# Patient Record
Sex: Male | Born: 1965
Health system: Southern US, Community
[De-identification: ages and names within clinical notes are randomized; demographics above are authoritative.]

## PROBLEM LIST (undated history)

## (undated) DIAGNOSIS — K279 Peptic ulcer, site unspecified, unspecified as acute or chronic, without hemorrhage or perforation: Secondary | ICD-10-CM

## (undated) HISTORY — PX: CERVICAL DISCECTOMY: SHX98

## (undated) HISTORY — PX: MIDDLE EAR SURGERY: SHX713

---

## 2012-06-29 ENCOUNTER — Emergency Department (HOSPITAL_COMMUNITY): Payer: Self-pay

## 2012-06-29 ENCOUNTER — Emergency Department (HOSPITAL_COMMUNITY)
Admission: EM | Admit: 2012-06-29 | Discharge: 2012-06-29 | Disposition: A | Discharge status: Home / Self Care | Payer: Self-pay | Attending: Emergency Medicine | Admitting: Emergency Medicine

## 2012-06-29 DIAGNOSIS — W1789XA Other fall from one level to another, initial encounter: Secondary | ICD-10-CM | POA: Insufficient documentation

## 2012-06-29 DIAGNOSIS — M25519 Pain in unspecified shoulder: Secondary | ICD-10-CM

## 2012-06-29 DIAGNOSIS — R079 Chest pain, unspecified: Secondary | ICD-10-CM | POA: Insufficient documentation

## 2012-06-29 DIAGNOSIS — S20219A Contusion of unspecified front wall of thorax, initial encounter: Secondary | ICD-10-CM

## 2012-06-29 DIAGNOSIS — R209 Unspecified disturbances of skin sensation: Secondary | ICD-10-CM | POA: Insufficient documentation

## 2012-06-29 DIAGNOSIS — R51 Headache: Secondary | ICD-10-CM | POA: Insufficient documentation

## 2012-06-29 DIAGNOSIS — W139XXA Fall from, out of or through building, not otherwise specified, initial encounter: Secondary | ICD-10-CM

## 2012-06-29 MED ORDER — HYDROCODONE-ACETAMINOPHEN 5-500 MG PO TABS: 1.0000 | ORAL_TABLET | Freq: Four times a day (QID) | ORAL | Status: AC | PRN

## 2012-06-29 MED ORDER — OXYCODONE-ACETAMINOPHEN 5-325 MG PO TABS
1.0000 | ORAL_TABLET | Freq: Once | ORAL | Status: AC
  Administered 2012-06-29: 1 via ORAL
  Filled 2012-06-29: qty 1

## 2012-06-29 MED ORDER — NAPROXEN 500 MG PO TABS: 500.0000 mg | ORAL_TABLET | Freq: Two times a day (BID) | ORAL | Status: DC

## 2012-06-29 NOTE — ED Notes (Signed)
Ortho tech called 

## 2012-06-29 NOTE — ED Notes (Signed)
Patient is alert and oriented x3.  He is complaining of rib pain, right shoulder after  A fall from a roof approximately 12 feet.  He denies any LOC or head impact.   He states he does have a headache and also difficulty with taking deep breaths. Patient has been ambulatory after fall.

## 2012-06-29 NOTE — ED Provider Notes (Signed)
Medical screening examination/treatment/procedure(s) were performed by non-physician practitioner and as supervising physician I was immediately available for consultation/collaboration.   Rolan Bucco, MD 06/29/12 1538

## 2012-06-29 NOTE — ED Provider Notes (Signed)
History     CSN: [id number]  Arrival date & time 06/29/12  1048   First MD Initiated Contact with Patient 06/29/12 1051      Chief Complaint  Patient presents with  . Fall    (Consider location/radiation/quality/duration/timing/severity/associated sxs/prior treatment) Patient is a 46 y.o. male presenting with fall. The history is provided by the patient.  Fall The accident occurred 1 to 2 hours ago. The fall occurred from a roof. He fell from a height of 11 to 15 ft. He landed on grass. There was no blood loss. Point of impact: right side of the body. The pain is present in the right shoulder (right ribs). The pain is at a severity of 8/10. The pain is moderate. Associated symptoms include headaches and tingling. Pertinent negatives include no fever, no numbness, no abdominal pain, no nausea, no vomiting and no loss of consciousness. The symptoms are aggravated by activity. He has tried nothing for the symptoms.  Pt states he slipped and fell off the roof landing flat onto right side in the bushes. States pain to right shoulder and right ribs. Pain with deep inspiration and movement of the shoulder. Denies hitting his head, no neck pain. States right hand feels 'tingly" but no numbness or weakness. States abdomen feels "uncomfortable" but no pain. Denies LOC. Denies nausea, vomiting, dizziness. Ambulatory since then. Did not take any medications.   No past medical history on file.  No past surgical history on file.  No family history on file.  History  Substance Use Topics  . Smoking status: Not on file  . Smokeless tobacco: Not on file  . Alcohol Use: Not on file      Review of Systems  Constitutional: Negative for fever and chills.  HENT: Negative for neck pain and neck stiffness.   Respiratory: Negative.   Cardiovascular: Positive for chest pain.  Gastrointestinal: Negative for nausea, vomiting and abdominal pain.  Musculoskeletal: Positive for joint swelling and  arthralgias.  Skin: Negative for wound.  Neurological: Positive for tingling and headaches. Negative for dizziness, loss of consciousness, weakness, light-headedness and numbness.    Allergies  Review of patient's allergies indicates no known allergies.  Home Medications   Current Outpatient Rx  Name Route Sig Dispense Refill  . CETIRIZINE HCL 10 MG PO TABS Oral Take 10 mg by mouth daily.    . OMEGA-3 FATTY ACIDS 1000 MG PO CAPS Oral Take 1 g by mouth daily.      BP 139/78  Pulse 98  Temp 97.9 F (36.6 C) (Oral)  Resp 18  SpO2 100%  Physical Exam  Nursing note and vitals reviewed. Constitutional: He is oriented to person, place, and time. He appears well-developed and well-nourished. No distress.  Neck: Normal range of motion. Neck supple.       No midline tenderness, no paravertebral tenderness. No swelling, bruising, deformity  Cardiovascular: Normal rate, regular rhythm and normal heart sounds.   Pulmonary/Chest: Effort normal and breath sounds normal. No respiratory distress. He has no wheezes. He has no rales.       Tender to palpation over right lower ribs. No deformity, no crepitus  Abdominal: Soft. Bowel sounds are normal. He exhibits no distension. There is no tenderness. There is no rebound and no guarding.  Musculoskeletal:       Tender to palpation over right shoulder and scapula. No deformity. No bruising, no swelling. Pain with movement of the shoulder joint. Unable to actively move, pain with passive movement over  90 degrees. Normal elbow and wrist exam with full rom with no pain. Distal radial pulses normal and equal bilaterally. No tenderness or deformity over the clavicle  Lymphadenopathy:    He has no cervical adenopathy.  Neurological: He is alert and oriented to person, place, and time.       Equal grip strength bilaterally. Normal sensation over right arm and hand compared to left  Skin: Skin is warm and dry.    ED Course  Procedures (including critical  care time)  Pt post fall about 84ft. No injury to the head, neck, no LOC. C spine cleared using nexus criteria. Pt with pain to right shoulder, right ribs. No abdominal pain or tenderness. No neuro deficits. Will get x-rays.   No results found for this or any previous visit. Dg Ribs Unilateral W/chest Right  06/29/2012  *RADIOLOGY REPORT*  Clinical Data: Status post fall.  Right chest pain.  RIGHT RIBS AND CHEST - 3+ VIEW  Comparison: None.  Findings: Lungs are clear.  No pneumothorax or pleural fluid. Heart size is normal.  Remote right clavicle fracture is noted.  No acute fracture is identified.  Tiny radiopaque density projecting over the right upper quadrant may be a calcification within the liver or possibly a subcutaneous foreign body.  IMPRESSION: No acute finding.  Old right clavicle fracture.  Original Report Authenticated By: Bernadene Bell. Maricela Curet, M.D.   Dg Scapula Right  06/29/2012  *RADIOLOGY REPORT*  Clinical Data: Fall, pain.  RIGHT SCAPULA - 2+ VIEWS  Comparison: None.  Findings: No acute bony or joint abnormality is identified.  Old healed right clavicle fracture and mild acromioclavicular degenerative change noted.  IMPRESSION: No acute finding.  Original Report Authenticated By: Bernadene Bell. Maricela Curet, M.D.   Dg Shoulder Right  06/29/2012  *RADIOLOGY REPORT*  Clinical Data: Status post fall.  Pain.  RIGHT SHOULDER - 2+ VIEW  Comparison: None.  Findings: The humerus is located and the acromioclavicular joint is intact with some degenerative change noted.  There is no acute fracture.  Old healed right clavicle fracture is noted.  IMPRESSION: No acute finding.  Old healed right clavicle fracture and mild appearing acromioclavicular degenerative change.  Original Report Authenticated By: Bernadene Bell. D'ALESSIO, M.D.   12:48 PM X-rays are negative. Pt in NAD, he is non toxic. He is walking around the room. Moving shoulder more after percocet. States he feels well. Will d/c home with a sling.  Follow up with orthopedics.      1. Fall from building   2. Shoulder pain   3. Rib contusion       MDM          Lottie Mussel, PA 06/29/12 1524

## 2012-08-22 ENCOUNTER — Emergency Department (HOSPITAL_COMMUNITY): Payer: Self-pay

## 2012-08-22 ENCOUNTER — Emergency Department (HOSPITAL_COMMUNITY)
Admission: EM | Admit: 2012-08-22 | Discharge: 2012-08-22 | Disposition: A | Discharge status: Home / Self Care | Payer: Self-pay | Attending: Emergency Medicine | Admitting: Emergency Medicine

## 2012-08-22 ENCOUNTER — Encounter (HOSPITAL_COMMUNITY): Payer: Self-pay | Admitting: Emergency Medicine

## 2012-08-22 DIAGNOSIS — F17200 Nicotine dependence, unspecified, uncomplicated: Secondary | ICD-10-CM | POA: Insufficient documentation

## 2012-08-22 DIAGNOSIS — S60949A Unspecified superficial injury of unspecified finger, initial encounter: Secondary | ICD-10-CM

## 2012-08-22 DIAGNOSIS — S61209A Unspecified open wound of unspecified finger without damage to nail, initial encounter: Secondary | ICD-10-CM | POA: Insufficient documentation

## 2012-08-22 DIAGNOSIS — W298XXA Contact with other powered powered hand tools and household machinery, initial encounter: Secondary | ICD-10-CM | POA: Insufficient documentation

## 2012-08-22 MED ORDER — HYDROCODONE-ACETAMINOPHEN 5-325 MG PO TABS: 2.0000 | ORAL_TABLET | ORAL | Status: AC | PRN

## 2012-08-22 MED ORDER — IBUPROFEN 800 MG PO TABS: 800.0000 mg | ORAL_TABLET | Freq: Three times a day (TID) | ORAL | Status: AC

## 2012-08-22 NOTE — ED Notes (Signed)
1.5 cm Laceration to pad of index finger on l/hand. Edges not well approx. Bleeding controlled when pressure removed

## 2012-08-22 NOTE — ED Provider Notes (Signed)
History     CSN: [id number]  Arrival date & time 08/22/12  1020   First MD Initiated Contact with Patient 08/22/12 1048      Chief Complaint  Patient presents with  . Finger Injury    laceration to index finger on l/lhand due to circular saw    (Consider location/radiation/quality/duration/timing/severity/associated sxs/prior treatment) HPI Comments: Patient presents after an injury to his left index finger pad due to a circular saw. The injury occurred 1 hour ago. The pain immediately occurred after injury along with bleeding from injury site. The pain has continued since initial injury and is severe and throbbing. Patient reports intolerable pain with any contact to the wound. He did not take anything for pain. He denies any alleviating factors. He denies numbness/tingling, abnormal coordination, coolness/paleness of extremity.    History reviewed. No pertinent past medical history.  Past Surgical History  Procedure Date  . Middle ear surgery   . Cervical discectomy     History reviewed. No pertinent family history.  History  Substance Use Topics  . Smoking status: Current Everyday Smoker    Types: Cigarettes  . Smokeless tobacco: Not on file  . Alcohol Use: Yes      Review of Systems  Skin: Positive for wound.  All other systems reviewed and are negative.    Allergies  Review of patient's allergies indicates no known allergies.  Home Medications   Current Outpatient Rx  Name Route Sig Dispense Refill  . B COMPLEX-C PO TABS Oral Take 1 tablet by mouth daily.    Marland Kitchen CETIRIZINE HCL 10 MG PO TABS Oral Take 10 mg by mouth daily.    . OMEGA-3 FATTY ACIDS 1000 MG PO CAPS Oral Take 1 g by mouth daily.      BP 137/84  Pulse 90  Temp 97.9 F (36.6 C) (Oral)  Resp 16  Ht 5\' 10"  (1.778 m)  Wt 185 lb (83.915 kg)  BMI 26.54 kg/m2  SpO2 98%  Physical Exam  Nursing note and vitals reviewed. Constitutional: He is oriented to person, place, and time. He appears  well-developed and well-nourished. No distress.  HENT:  Head: Normocephalic and atraumatic.  Eyes: Conjunctivae are normal. No scleral icterus.  Neck: Normal range of motion. Neck supple.  Cardiovascular: Normal rate, regular rhythm and intact distal pulses.  Exam reveals no gallop and no friction rub.   No murmur heard.      Capillary refill sufficient of upper extremities.   Pulmonary/Chest: Effort normal and breath sounds normal. No respiratory distress. He has no wheezes. He has no rales. He exhibits no tenderness.  Musculoskeletal: Normal range of motion.  Neurological: He is alert and oriented to person, place, and time.  Skin: Skin is warm and dry. He is not diaphoretic.       1 cm superficial laceration of left index finger pad.   Psychiatric: He has a normal mood and affect. His behavior is normal.    ED Course  Procedures (including critical care time)  LACERATION REPAIR Performed by: Emilia Beck, Ruben Reason, PA-S2 Authorized by: Emilia Beck Consent: Verbal consent obtained. Risks and benefits: risks, benefits and alternatives were discussed Consent given by: patient Patient identity confirmed: provided demographic data Prepped and Draped in normal sterile fashion Wound explored  Laceration Location: left index finger pad  Laceration Length: 1.0 cm  No Foreign Bodies seen or palpated  Anesthesia: digital block, local infiltration  Local anesthetic: lidocaine 2 % without epinephrine  Anesthetic total: 5.0  ml  Irrigation method: syringe Amount of cleaning: standard  Skin closure: 4.0 prolene  Number of sutures: 0, attempted closure but unsuccessful due to patient intolerance  Technique: attempted simple  Patient tolerance: Patient did not tolerate the procedure and steri strips were used instead of sutures.   Labs Reviewed - No data to display Dg Finger Index Left  08/22/2012  *RADIOLOGY REPORT*  Clinical Data: Cut with saw  LEFT INDEX FINGER  2+V  Comparison: None.  Findings: No acute fracture is seen.  Alignment is normal.  Joint spaces appear normal.  No opaque foreign body is seen.  IMPRESSION: Negative.   Original Report Authenticated By: Juline Patch, M.D.      1. Injury of finger, superficial       MDM  12:47 PM No evidence of fracture or foreign body at injury site. Wound closed with steri strips and bandaged. I will discharge him with pain medication and instructions to return to the ED with any worsening or concerning symptoms.   Patient tolerated steri strips application and dressing. He was instructed to keep the wound covered for 3 days and especially at work. I prescribed Norco for pain. No further evaluation needed at this time.       Emilia Beck, [place] 08/24/12 [phone]

## 2012-08-22 NOTE — ED Notes (Signed)
Wound care completed by PA.

## 2012-08-22 NOTE — ED Notes (Addendum)
Patient transported to X-ray 

## 2012-08-27 NOTE — ED Provider Notes (Signed)
I saw and evaluated the patient, reviewed the resident's note and I agree with the findings and plan.   Torrez Renfroe, MD 08/27/12 0818 

## 2017-02-28 ENCOUNTER — Emergency Department (HOSPITAL_COMMUNITY): Payer: Self-pay

## 2017-02-28 ENCOUNTER — Encounter (HOSPITAL_COMMUNITY): Payer: Self-pay | Admitting: Emergency Medicine

## 2017-02-28 ENCOUNTER — Emergency Department (HOSPITAL_COMMUNITY)
Admission: EM | Admit: 2017-02-28 | Discharge: 2017-02-28 | Disposition: A | Discharge status: Home / Self Care | Payer: Self-pay | Attending: Emergency Medicine | Admitting: Emergency Medicine

## 2017-02-28 DIAGNOSIS — F17210 Nicotine dependence, cigarettes, uncomplicated: Secondary | ICD-10-CM | POA: Insufficient documentation

## 2017-02-28 DIAGNOSIS — R1013 Epigastric pain: Secondary | ICD-10-CM | POA: Insufficient documentation

## 2017-02-28 LAB — COMPREHENSIVE METABOLIC PANEL
ALBUMIN: 4.7 g/dL (ref 3.5–5.0)
ALK PHOS: 76 U/L (ref 38–126)
ALT: 18 U/L (ref 17–63)
AST: 22 U/L (ref 15–41)
Anion gap: 8 (ref 5–15)
BUN: 19 mg/dL (ref 6–20)
CALCIUM: 9.7 mg/dL (ref 8.9–10.3)
CHLORIDE: 102 mmol/L (ref 101–111)
CO2: 28 mmol/L (ref 22–32)
CREATININE: 0.89 mg/dL (ref 0.61–1.24)
GFR calc Af Amer: >60 mL/min (ref >60)
GFR calc non Af Amer: >60 mL/min (ref >60)
GLUCOSE: 107 mg/dL — AB (ref 65–99)
Potassium: 3.8 mmol/L (ref 3.5–5.1)
SODIUM: 138 mmol/L (ref 135–145)
Total Bilirubin: 0.3 mg/dL (ref 0.3–1.2)
Total Protein: 7.9 g/dL (ref 6.5–8.1)

## 2017-02-28 LAB — I-STAT TROPONIN, ED: Troponin i, poc: 0 ng/mL (ref 0.00–0.08)

## 2017-02-28 LAB — I-STAT CHEM 8, ED
BUN: 23 mg/dL — ABNORMAL HIGH (ref 6–20)
CHLORIDE: 101 mmol/L (ref 101–111)
Calcium, Ion: 1.07 mmol/L — ABNORMAL LOW (ref 1.15–1.40)
Creatinine, Ser: 0.9 mg/dL (ref 0.61–1.24)
Glucose, Bld: 98 mg/dL (ref 65–99)
HEMATOCRIT: 46 % (ref 39.0–52.0)
Hemoglobin: 15.6 g/dL (ref 13.0–17.0)
POTASSIUM: 4.4 mmol/L (ref 3.5–5.1)
Sodium: 139 mmol/L (ref 135–145)
TCO2: 28 mmol/L (ref 0–100)

## 2017-02-28 LAB — CBC
HEMATOCRIT: 43.1 % (ref 39.0–52.0)
HEMOGLOBIN: 14.7 g/dL (ref 13.0–17.0)
MCH: 28.6 pg (ref 26.0–34.0)
MCHC: 34.1 g/dL (ref 30.0–36.0)
MCV: 83.9 fL (ref 78.0–100.0)
Platelets: 268 10*3/uL (ref 150–400)
RBC: 5.14 MIL/uL (ref 4.22–5.81)
RDW: 13.2 % (ref 11.5–15.5)
WBC: 11.8 10*3/uL — ABNORMAL HIGH (ref 4.0–10.5)

## 2017-02-28 LAB — LIPASE, BLOOD: Lipase: 32 U/L (ref 11–51)

## 2017-02-28 LAB — I-STAT CG4 LACTIC ACID, ED
LACTIC ACID, VENOUS: 1.47 mmol/L (ref 0.5–1.9)
Lactic Acid, Venous: 3.69 mmol/L (ref 0.5–1.9)

## 2017-02-28 MED ORDER — IOPAMIDOL (ISOVUE-370) INJECTION 76%
INTRAVENOUS | Status: AC
  Filled 2017-02-28: qty 100

## 2017-02-28 MED ORDER — GI COCKTAIL ~~LOC~~
30.0000 mL | Freq: Once | ORAL | Status: AC
  Administered 2017-02-28: 30 mL via ORAL
  Filled 2017-02-28: qty 30

## 2017-02-28 MED ORDER — ONDANSETRON HCL 4 MG/2ML IJ SOLN
4.0000 mg | Freq: Once | INTRAMUSCULAR | Status: AC
  Administered 2017-02-28: 4 mg via INTRAVENOUS
  Filled 2017-02-28: qty 2

## 2017-02-28 MED ORDER — SUCRALFATE 1 GM/10ML PO SUSP: 1.0000 g | Freq: Three times a day (TID) | ORAL | 0 refills | Status: DC

## 2017-02-28 MED ORDER — SODIUM CHLORIDE 0.9 % IV BOLUS (SEPSIS)
1000.0000 mL | Freq: Once | INTRAVENOUS | Status: AC
  Administered 2017-02-28: 1000 mL via INTRAVENOUS

## 2017-02-28 MED ORDER — IOPAMIDOL (ISOVUE-370) INJECTION 76%
100.0000 mL | Freq: Once | INTRAVENOUS | Status: AC | PRN
  Administered 2017-02-28: 100 mL via INTRAVENOUS

## 2017-02-28 MED ORDER — FAMOTIDINE IN NACL 20-0.9 MG/50ML-% IV SOLN
20.0000 mg | Freq: Once | INTRAVENOUS | Status: AC
  Administered 2017-02-28: 20 mg via INTRAVENOUS
  Filled 2017-02-28: qty 50

## 2017-02-28 MED ORDER — OMEPRAZOLE 20 MG PO CPDR: 20.0000 mg | DELAYED_RELEASE_CAPSULE | Freq: Every day | ORAL | 0 refills | Status: DC

## 2017-02-28 MED ORDER — HYDROMORPHONE HCL 1 MG/ML IJ SOLN
1.0000 mg | Freq: Once | INTRAMUSCULAR | Status: AC
  Administered 2017-02-28: 1 mg via INTRAVENOUS
  Filled 2017-02-28: qty 1

## 2017-02-28 NOTE — ED Provider Notes (Signed)
[facility]-EMERGENCY DEPT Provider Note   CSN: [id number] Arrival date & time: 02/28/17  0732     History   Chief Complaint Chief Complaint  Patient presents with  . Chest Pain    HPI Fernando Garrison is a 51 y.o. male.  Patient presents to the ED with a chief complaint of severe epigastric pain.  He states that symptoms started suddenly at 430a.  He states that the pain radiates to his chest and ribs.  He reports associated SOB and one episode of vomiting.  He denies ever having pain like this before.  He does not drink alcohol.  Denies any hx of pancreatitis.  He states that he took 4 TUMS, 81mg  ASA, and 1 SL nitro with no relief.  There are no modifying factors.   The history is provided by the patient. No language interpreter was used.    History reviewed. No pertinent past medical history.  There are no active problems to display for this patient.   Past Surgical History:  Procedure Laterality Date  . CERVICAL DISCECTOMY    . MIDDLE EAR SURGERY         Home Medications    Prior to Admission medications   Medication Sig Start Date End Date Taking? Authorizing Provider  B Complex-C (B-COMPLEX WITH VITAMIN C) tablet Take 1 tablet by mouth daily.    Historical Provider, MD  cetirizine (ZYRTEC) 10 MG tablet Take 10 mg by mouth daily.    Historical Provider, MD  fish oil-omega-3 fatty acids 1000 MG capsule Take 1 g by mouth daily.    Historical Provider, MD    Family History No family history on file.  Social History Social History  Substance Use Topics  . Smoking status: Current Every Day Smoker    Types: Cigarettes  . Smokeless tobacco: Never Used  . Alcohol use Yes     Allergies   Patient has no known allergies.   Review of Systems Review of Systems  Respiratory: Positive for shortness of breath.   Cardiovascular: Positive for chest pain.  Gastrointestinal: Positive for abdominal pain, nausea and vomiting.  All other systems reviewed and are  negative.    Physical Exam Updated Vital Signs BP 141/93 (BP Location: Left Arm)   Pulse 91   Resp 26   Ht 5\' 10"  (1.778 m)   Wt 83.9 kg   SpO2 100%   BMI 26.54 kg/m   Physical Exam  Constitutional: He is oriented to person, place, and time. He appears well-developed and well-nourished.  Visibly uncomfortable  HENT:  Head: Normocephalic and atraumatic.  Eyes: Conjunctivae and EOM are normal. Pupils are equal, round, and reactive to light. Right eye exhibits no discharge. Left eye exhibits no discharge. No scleral icterus.  Neck: Normal range of motion. Neck supple. No JVD present.  Cardiovascular: Normal rate, regular rhythm and normal heart sounds.  Exam reveals no gallop and no friction rub.   No murmur heard. Pulmonary/Chest: Effort normal and breath sounds normal. No respiratory distress. He has no wheezes. He has no rales. He exhibits no tenderness.  Abdominal: Soft. He exhibits no distension and no mass. There is tenderness. There is no rebound and no guarding.  TTP in the epigastrium  Musculoskeletal: Normal range of motion. He exhibits no edema or tenderness.  Moves all extremities  Neurological: He is alert and oriented to person, place, and time.  Sensation and strength intact  Skin: Skin is warm and dry.  Psychiatric: He has a normal mood and  affect. His behavior is normal. Judgment and thought content normal.  Nursing note and vitals reviewed.    ED Treatments / Results  Labs (all labs ordered are listed, but only abnormal results are displayed) Labs Reviewed  CBC - Abnormal; Notable for the following:       Result Value   WBC 11.8 (*)    All other components within normal limits  COMPREHENSIVE METABOLIC PANEL - Abnormal; Notable for the following:    Glucose, Bld 107 (*)    All other components within normal limits  I-STAT CHEM 8, ED - Abnormal; Notable for the following:    BUN 23 (*)    Calcium, Ion 1.07 (*)    All other components within normal  limits  I-STAT CG4 LACTIC ACID, ED - Abnormal; Notable for the following:    Lactic Acid, Venous 3.69 (*)    All other components within normal limits  LIPASE, BLOOD  I-STAT TROPOININ, ED  I-STAT CG4 LACTIC ACID, ED  I-STAT CG4 LACTIC ACID, ED    EKG  EKG Interpretation  Date/Time:  Tuesday February 28 2017 07:39:05 EDT Ventricular Rate:  86 PR Interval:    QRS Duration: 89 QT Interval:  354 QTC Calculation: 424 R Axis:   85 Text Interpretation:  Sinus rhythm No old tracing to compare Confirmed by KNAPP  MD-J, JON ([id number]) on 02/28/2017 7:44:42 AM Also confirmed by [facility]  MD-J, JON [phone]), editor [place] CT, Jola BabinskiMarilyn [phone]([id number]  on 02/28/2017 7:55:23 AM       Radiology No results found.  Procedures Procedures (including critical care time)  Medications Ordered in ED Medications  HYDROmorphone (DILAUDID) injection 1 mg (not administered)  ondansetron (ZOFRAN) injection 4 mg (not administered)     Initial Impression / Assessment and Plan / ED Course  I have reviewed the triage vital signs and the nursing notes.  Pertinent labs & imaging results that were available during my care of the patient were reviewed by me and considered in my medical decision making (see chart for details).     Patient with sudden onset, severe, epigastric pain that radiates to chest w/SOB.  BP stable.  Neurovascularly intact.  Will get dissection study, but also consider perforated gastric ulcer, pancreatitis, or other.  Will check labs, treat, pain, and monitor closely while waiting for CT.  Discussed patient with Dr. Lynelle DoctorKnapp, who agrees with workup plan.  CT is reassuring. There are no acute findings on CT. Patient feels significantly better after treatment. His laboratory workup is reassuring. Given that the CT is negative, I have a low suspicion for dissection, pancreatitis, cholecystitis, or perforation, leaving increased likelihood of peptic ulcer disease/GERD. Will treat with omeprazole and Carafate.  Recommend primary care follow-up. Return precautions discussed. Patient understands and agrees the plan. He is stable and ready for discharge.  Final Clinical Impressions(s) / ED Diagnoses   Final diagnoses:  Epigastric pain    New Prescriptions New Prescriptions   No medications on file     Roxy HorsemanRobert Dillinger Aston, PA-C 02/28/17 1439    Linwood DibblesJon Knapp, MD 03/02/17 [phone]

## 2017-02-28 NOTE — ED Triage Notes (Signed)
Patient states that he has lower rib pain since around 430a that has been constant causing SOB.  Patient had one bout of vomiting.  Patient had 4 Tums tablet, ASA 81mg , 1 Nitroglycerine tablet.

## 2018-03-29 ENCOUNTER — Emergency Department (HOSPITAL_COMMUNITY): Payer: Self-pay

## 2018-03-29 ENCOUNTER — Encounter (HOSPITAL_COMMUNITY): Payer: Self-pay | Admitting: Emergency Medicine

## 2018-03-29 ENCOUNTER — Other Ambulatory Visit: Payer: Self-pay

## 2018-03-29 ENCOUNTER — Emergency Department (HOSPITAL_COMMUNITY)
Admission: EM | Admit: 2018-03-29 | Discharge: 2018-03-30 | Disposition: A | Discharge status: Home / Self Care | Payer: Self-pay | Attending: Emergency Medicine | Admitting: Emergency Medicine

## 2018-03-29 DIAGNOSIS — Z79899 Other long term (current) drug therapy: Secondary | ICD-10-CM | POA: Insufficient documentation

## 2018-03-29 DIAGNOSIS — Z7982 Long term (current) use of aspirin: Secondary | ICD-10-CM | POA: Insufficient documentation

## 2018-03-29 DIAGNOSIS — R52 Pain, unspecified: Secondary | ICD-10-CM

## 2018-03-29 DIAGNOSIS — F17210 Nicotine dependence, cigarettes, uncomplicated: Secondary | ICD-10-CM | POA: Insufficient documentation

## 2018-03-29 DIAGNOSIS — R110 Nausea: Secondary | ICD-10-CM | POA: Insufficient documentation

## 2018-03-29 DIAGNOSIS — R1013 Epigastric pain: Secondary | ICD-10-CM | POA: Insufficient documentation

## 2018-03-29 LAB — BASIC METABOLIC PANEL
Anion gap: 11 (ref 5–15)
BUN: 15 mg/dL (ref 6–20)
CO2: 29 mmol/L (ref 22–32)
CREATININE: 0.88 mg/dL (ref 0.61–1.24)
Calcium: 9.2 mg/dL (ref 8.9–10.3)
Chloride: 102 mmol/L (ref 101–111)
GFR calc Af Amer: >60 mL/min (ref >60)
GLUCOSE: 113 mg/dL — AB (ref 65–99)
Potassium: 4.1 mmol/L (ref 3.5–5.1)
SODIUM: 142 mmol/L (ref 135–145)

## 2018-03-29 LAB — I-STAT TROPONIN, ED
Troponin i, poc: 0 ng/mL (ref 0.00–0.08)
Troponin i, poc: 0 ng/mL (ref 0.00–0.08)

## 2018-03-29 LAB — CBC
HCT: 48.7 % (ref 39.0–52.0)
Hemoglobin: 16.2 g/dL (ref 13.0–17.0)
MCH: 29.3 pg (ref 26.0–34.0)
MCHC: 33.3 g/dL (ref 30.0–36.0)
MCV: 88.2 fL (ref 78.0–100.0)
PLATELETS: 282 10*3/uL (ref 150–400)
RBC: 5.52 MIL/uL (ref 4.22–5.81)
RDW: 13.4 % (ref 11.5–15.5)
WBC: 11.5 10*3/uL — AB (ref 4.0–10.5)

## 2018-03-29 LAB — HEPATIC FUNCTION PANEL
ALK PHOS: 70 U/L (ref 38–126)
ALT: 21 U/L (ref 17–63)
AST: 21 U/L (ref 15–41)
Albumin: 4.4 g/dL (ref 3.5–5.0)
BILIRUBIN DIRECT: 0.1 mg/dL (ref 0.1–0.5)
Indirect Bilirubin: 0.5 mg/dL (ref 0.3–0.9)
TOTAL PROTEIN: 8 g/dL (ref 6.5–8.1)
Total Bilirubin: 0.6 mg/dL (ref 0.3–1.2)

## 2018-03-29 LAB — LIPASE, BLOOD: LIPASE: 28 U/L (ref 11–51)

## 2018-03-29 MED ORDER — SODIUM CHLORIDE 0.9 % IV BOLUS: 1000.0000 mL | Freq: Once | INTRAVENOUS | Status: DC

## 2018-03-29 MED ORDER — GI COCKTAIL ~~LOC~~
30.0000 mL | Freq: Once | ORAL | Status: AC
  Administered 2018-03-30: 30 mL via ORAL
  Filled 2018-03-29: qty 30

## 2018-03-29 MED ORDER — ONDANSETRON HCL 4 MG/2ML IJ SOLN
4.0000 mg | Freq: Once | INTRAMUSCULAR | Status: DC
  Filled 2018-03-29: qty 2

## 2018-03-29 NOTE — ED Triage Notes (Signed)
Pt from home with c/o central cp that does not radiate. Pt states he has had an ulcer and it felt similar. Pt rates pain 10/10.  Pt states pain is worse with deep breath. Pt has strong bilateral radial pulses

## 2018-03-29 NOTE — ED Provider Notes (Signed)
[facility]-EMERGENCY DEPT Provider Note   CSN: [id number] Arrival date & time: 03/29/18  2001     History   Chief Complaint Chief Complaint  Patient presents with  . Chest Pain    HPI Fernando Garrison is a 52 y.o. male.  Patient presents with severe epigastric pain onset around 6 PM.  States this pain is been waxing and waning in intensity since coming and going.  He feels it similar to when he was diagnosed with an ulcer last year.  He denies any issues since and did not follow-up with anyone.  He tried taking baking soda and mustard at home with partial relief.  He has had nausea but no vomiting.  Contrary to triage note he did not have any chest pain and the pain is in his epigastrium and does not radiate.  Denies any blood in the stool.  Denies any diarrhea.  Denies any lower abdominal pain.  No pain with urination or blood in urine.  No leg pain or leg swelling.  States he is never had an EGD.  Denies any alcohol use or excessive NSAID use.  The history is provided by the patient.  Chest Pain   Associated symptoms include abdominal pain and nausea. Pertinent negatives include no cough, no dizziness, no fever, no headaches, no shortness of breath, no vomiting and no weakness.    History reviewed. No pertinent past medical history.  There are no active problems to display for this patient.   Past Surgical History:  Procedure Laterality Date  . CERVICAL DISCECTOMY    . MIDDLE EAR SURGERY          Home Medications    Prior to Admission medications   Medication Sig Start Date End Date Taking? Authorizing Provider  aspirin EC 81 MG tablet Take 81 mg by mouth daily as needed (for chest pain).    [provider]  calcium carbonate (TUMS - DOSED IN MG ELEMENTAL CALCIUM) 500 MG chewable tablet Chew 2 tablets by mouth 3 (three) times daily as needed for indigestion or heartburn.    [provider]  ibuprofen (ADVIL,MOTRIN) 200 MG tablet Take  800 mg by mouth every 6 (six) hours as needed for headache, mild pain or moderate pain.    [provider]  nitroGLYCERIN (NITROSTAT) 0.4 MG SL tablet Place 0.4 mg under the tongue every 5 (five) minutes as needed for chest pain.    [provider]  omeprazole (PRILOSEC) 20 MG capsule Take 1 capsule (20 mg total) by mouth daily. 02/28/17   Roxy HorsemanBrowning, Robert, PA-C  sucralfate (CARAFATE) 1 GM/10ML suspension Take 10 mLs (1 g total) by mouth 4 (four) times daily -  with meals and at bedtime. 02/28/17   Roxy HorsemanBrowning, Robert, PA-C    Family History No family history on file.  Social History Social History   Tobacco Use  . Smoking status: Current Every Day Smoker    Types: Cigarettes  . Smokeless tobacco: Never Used  Substance Use Topics  . Alcohol use: Yes  . Drug use: Yes    Types: Marijuana     Allergies   Patient has no known allergies.   Review of Systems Review of Systems  Constitutional: Negative for activity change, appetite change and fever.  HENT: Negative for congestion and rhinorrhea.   Eyes: Negative for visual disturbance.  Respiratory: Negative for cough, chest tightness and shortness of breath.   Cardiovascular: Positive for chest pain.  Gastrointestinal: Positive for abdominal pain and  nausea. Negative for diarrhea and vomiting.  Genitourinary: Negative for dysuria, hematuria and testicular pain.  Musculoskeletal: Negative for arthralgias and myalgias.  Skin: Negative for rash.  Neurological: Negative for dizziness, weakness and headaches.    all other systems are negative except as noted in the HPI and PMH.    Physical Exam Updated Vital Signs BP 132/83 (BP Location: Left Arm)   Pulse 88   Temp 98.2 F (36.8 C) (Oral)   Resp 18   SpO2 99%   Physical Exam  Constitutional: He is oriented to person, place, and time. He appears well-developed and well-nourished. No distress.  Appears uncomfortable  HENT:  Head: Normocephalic and atraumatic.    Mouth/Throat: Oropharynx is clear and moist. No oropharyngeal exudate.  Eyes: Pupils are equal, round, and reactive to light. Conjunctivae and EOM are normal.  Neck: Normal range of motion. Neck supple.  No meningismus.  Cardiovascular: Normal rate, regular rhythm, normal heart sounds and intact distal pulses.  No murmur heard. Pulmonary/Chest: Effort normal and breath sounds normal. No respiratory distress. He exhibits no tenderness.  Abdominal: Soft. There is tenderness. There is no rebound and no guarding.  Epigastric tenderness with voluntary guarding, no significant right upper quadrant tenderness  Musculoskeletal: Normal range of motion. He exhibits no edema or tenderness.  Neurological: He is alert and oriented to person, place, and time. No cranial nerve deficit. He exhibits normal muscle tone. Coordination normal.  No ataxia on finger to nose bilaterally. No pronator drift. 5/5 strength throughout. CN 2-12 intact.Equal grip strength. Sensation intact.   Skin: Skin is warm.  Psychiatric: He has a normal mood and affect. His behavior is normal.  Nursing note and vitals reviewed.    ED Treatments / Results  Labs (all labs ordered are listed, but only abnormal results are displayed) Labs Reviewed  BASIC METABOLIC PANEL - Abnormal; Notable for the following components:      Result Value   Glucose, Bld 113 (*)    All other components within normal limits  CBC - Abnormal; Notable for the following components:   WBC 11.5 (*)    All other components within normal limits  HEPATIC FUNCTION PANEL  LIPASE, BLOOD  I-STAT TROPONIN, ED  I-STAT TROPONIN, ED    EKG EKG Interpretation  Date/Time:  Thursday March 29 2018 20:11:01 EDT Ventricular Rate:  75 PR Interval:    QRS Duration: 92 QT Interval:  377 QTC Calculation: 421 R Axis:   62 Text Interpretation:  Sinus rhythm No significant change was found Confirmed by Glynn Octave [phone]) on 03/29/2018 11:15:26  PM   Radiology Dg Chest 2 View  Result Date: 03/29/2018 CLINICAL DATA:  52 y/o M; chest pain worse with deep inspiration. Shortness of breath. EXAM: CHEST - 2 VIEW COMPARISON:  02/28/2017 chest radiograph and CT. FINDINGS: Stable normal cardiac silhouette given projection and technique. Ill-defined opacity the left lung base. No pleural effusion or pneumothorax. Anterior cervical fusion hardware noted. Bones are otherwise unremarkable. IMPRESSION: Ill-defined opacity in left lung base may represent atelectasis or pneumonia. Electronically Signed   By: Mitzi Hansen M.D.   On: 03/29/2018 20:53   Dg Abd 2 Views  Result Date: 03/30/2018 CLINICAL DATA:  Sudden onset epigastric pain and nausea. History of reflux. EXAM: ABDOMEN - 2 VIEW COMPARISON:  None. FINDINGS: Moderate fecal retention within the colon. Lack of intraluminal bowel gas limits further assessment. No organomegaly there is no evidence of free air. No radio-opaque calculi. Facet arthropathy at L3-4 and L4-5 on  the right with facet hypertrophy and sclerosis. IMPRESSION: Moderate fecal stool burden. Relative paucity of small bowel gas limits further assessment. Electronically Signed   By: Tollie Eth M.D.   On: 03/30/2018 00:22    Procedures Procedures (including critical care time)  Medications Ordered in ED Medications  gi cocktail (Maalox,Lidocaine,Donnatal) (has no administration in time range)  ondansetron (ZOFRAN) injection 4 mg (has no administration in time range)  sodium chloride 0.9 % bolus 1,000 mL (has no administration in time range)     Initial Impression / Assessment and Plan / ED Course  I have reviewed the triage vital signs and the nursing notes.  Pertinent labs & imaging results that were available during my care of the patient were reviewed by me and considered in my medical decision making (see chart for details).    Constant epigastric pain since this evening similar to previous gastritis/ulcer  pain.  EKG is sinus rhythm.  Abdomen is soft with epigastric tenderness.  Low suspicion for ACS or pulmonary embolism.  Labs reassuring.  Normal LFTs and lipase.  Troponin negative x2.  Low suspicion for ACS or pulmonary embolism.  Patient feels improved and pain is resolved after GI cocktail. Acute abdominal series was negative.  He declines IV Protonix and declined CT scan.  States he is ready to go home.  Discharge delayed due to multiple critical patients in the ED.  Patient given prescription for Prilosec and Carafate and GI follow-up.  Return precautions discussed.  Advised to avoid alcohol, NSAIDs, caffeine, spicy foods.  Follow-up with GI.  Return precautions discussed. Final Clinical Impressions(s) / ED Diagnoses   Final diagnoses:  Epigastric pain    ED Discharge Orders    None       Ranae Casebier, Jeannett Senior, MD 03/30/18 [id number]

## 2018-03-30 ENCOUNTER — Emergency Department (HOSPITAL_COMMUNITY): Payer: Self-pay

## 2018-03-30 MED ORDER — OMEPRAZOLE 20 MG PO CPDR: 20.0000 mg | DELAYED_RELEASE_CAPSULE | Freq: Every day | ORAL | 0 refills | Status: DC

## 2018-03-30 MED ORDER — PANTOPRAZOLE SODIUM 40 MG IV SOLR
40.0000 mg | Freq: Once | INTRAVENOUS | Status: DC
  Filled 2018-03-30: qty 40

## 2018-03-30 MED ORDER — SUCRALFATE 1 G PO TABS
1.0000 g | ORAL_TABLET | Freq: Three times a day (TID) | ORAL | 0 refills | Status: DC
Start: 2018-03-30 — End: 2019-11-17

## 2018-03-30 NOTE — ED Notes (Signed)
Patient states he does not want an IV and wants to be discharge with a prescription.

## 2018-03-30 NOTE — Discharge Instructions (Signed)
Take the stomach medication as prescribed.  Follow-up with a gastroenterologist for further testing.  Avoid alcohol, NSAIDs, caffeine, spicy foods.  Return to the ED feel chest pain, shortness of breath or any other concerns.

## 2019-03-13 IMAGING — CR DG CHEST 2V
2 series · 2 of 2 positions shown · non-contrast
Comparison: 02/28/2017 chest radiograph and CT.

CLINICAL DATA: 51 y/o M; chest pain worse with deep inspiration.
Shortness of breath.

EXAM:
CHEST - 2 VIEW

[w chest pa]
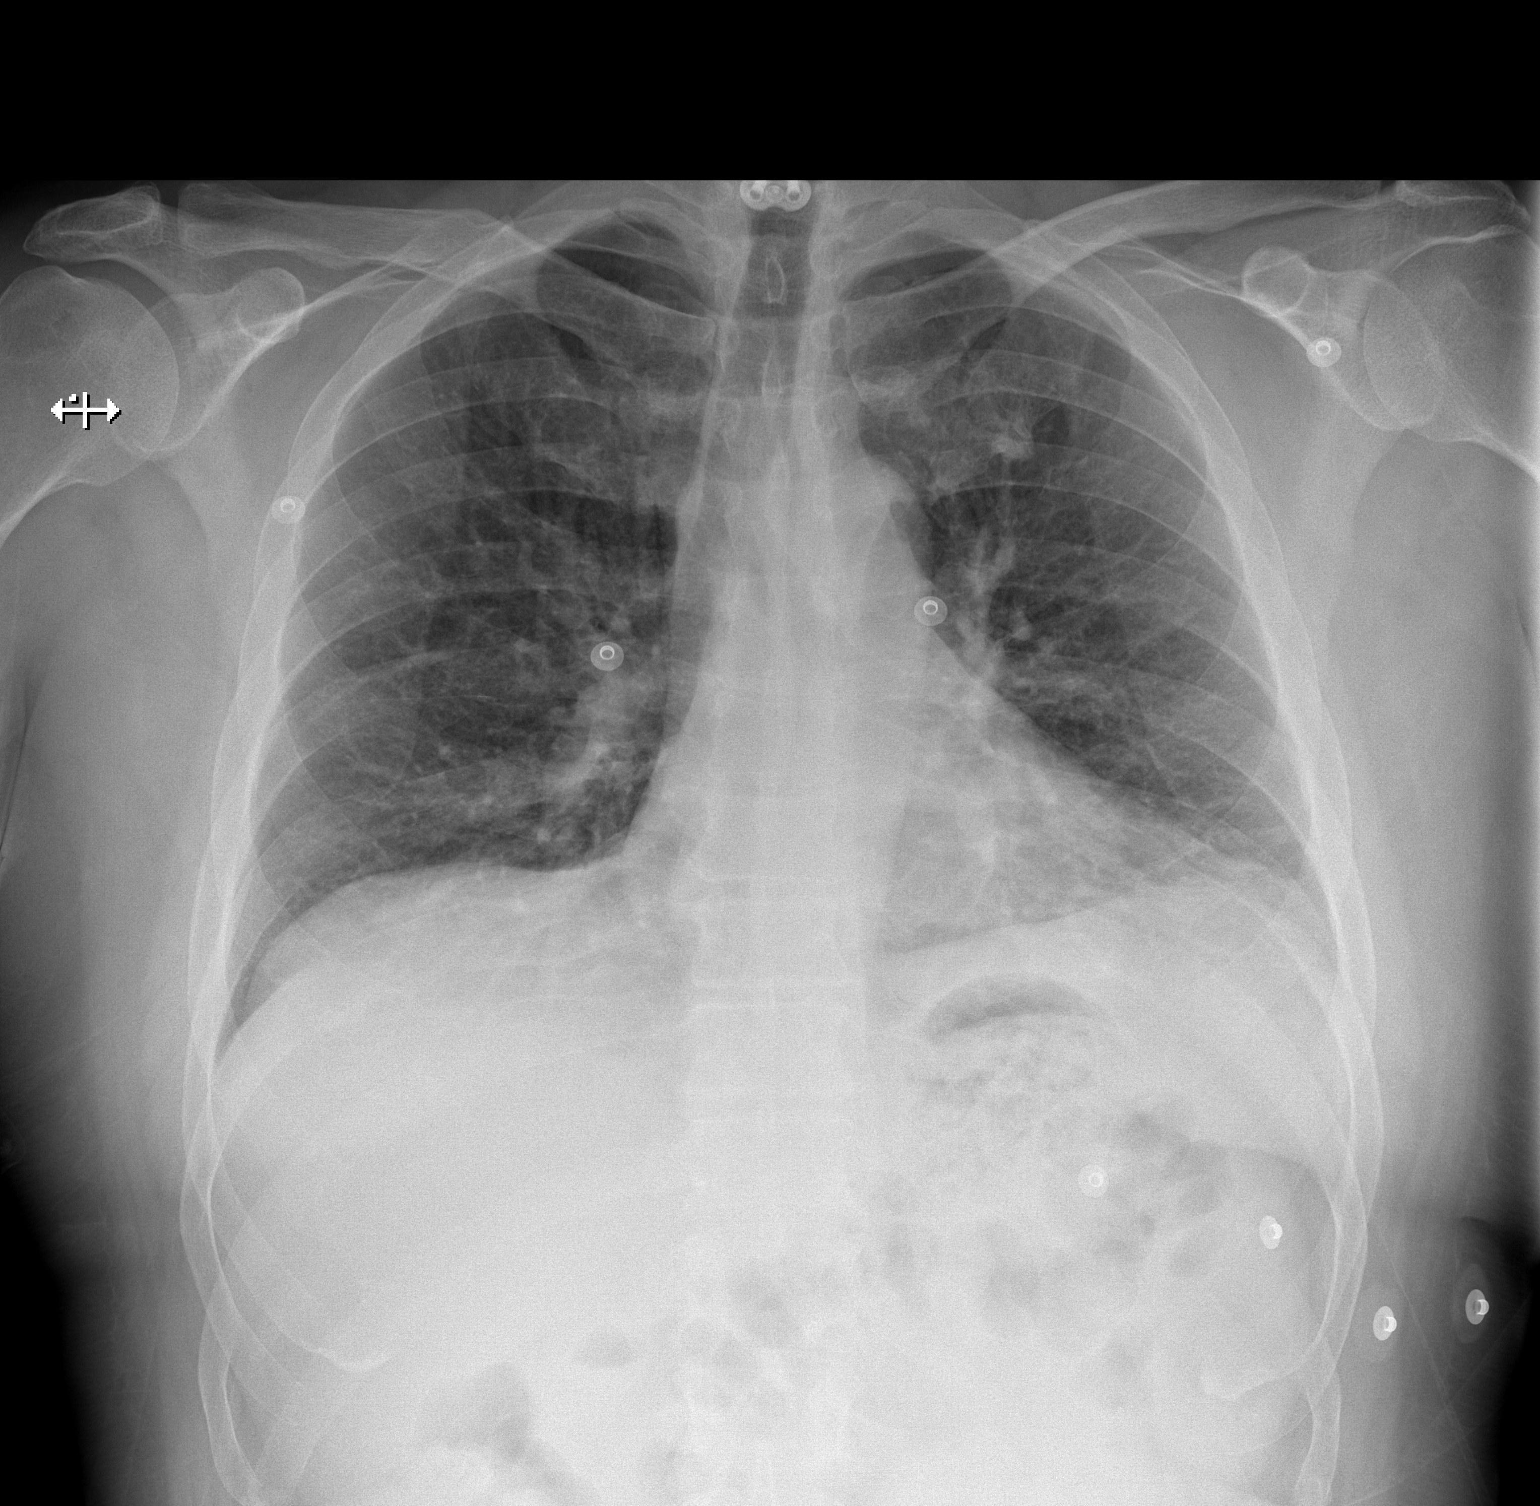

[w chest lat]
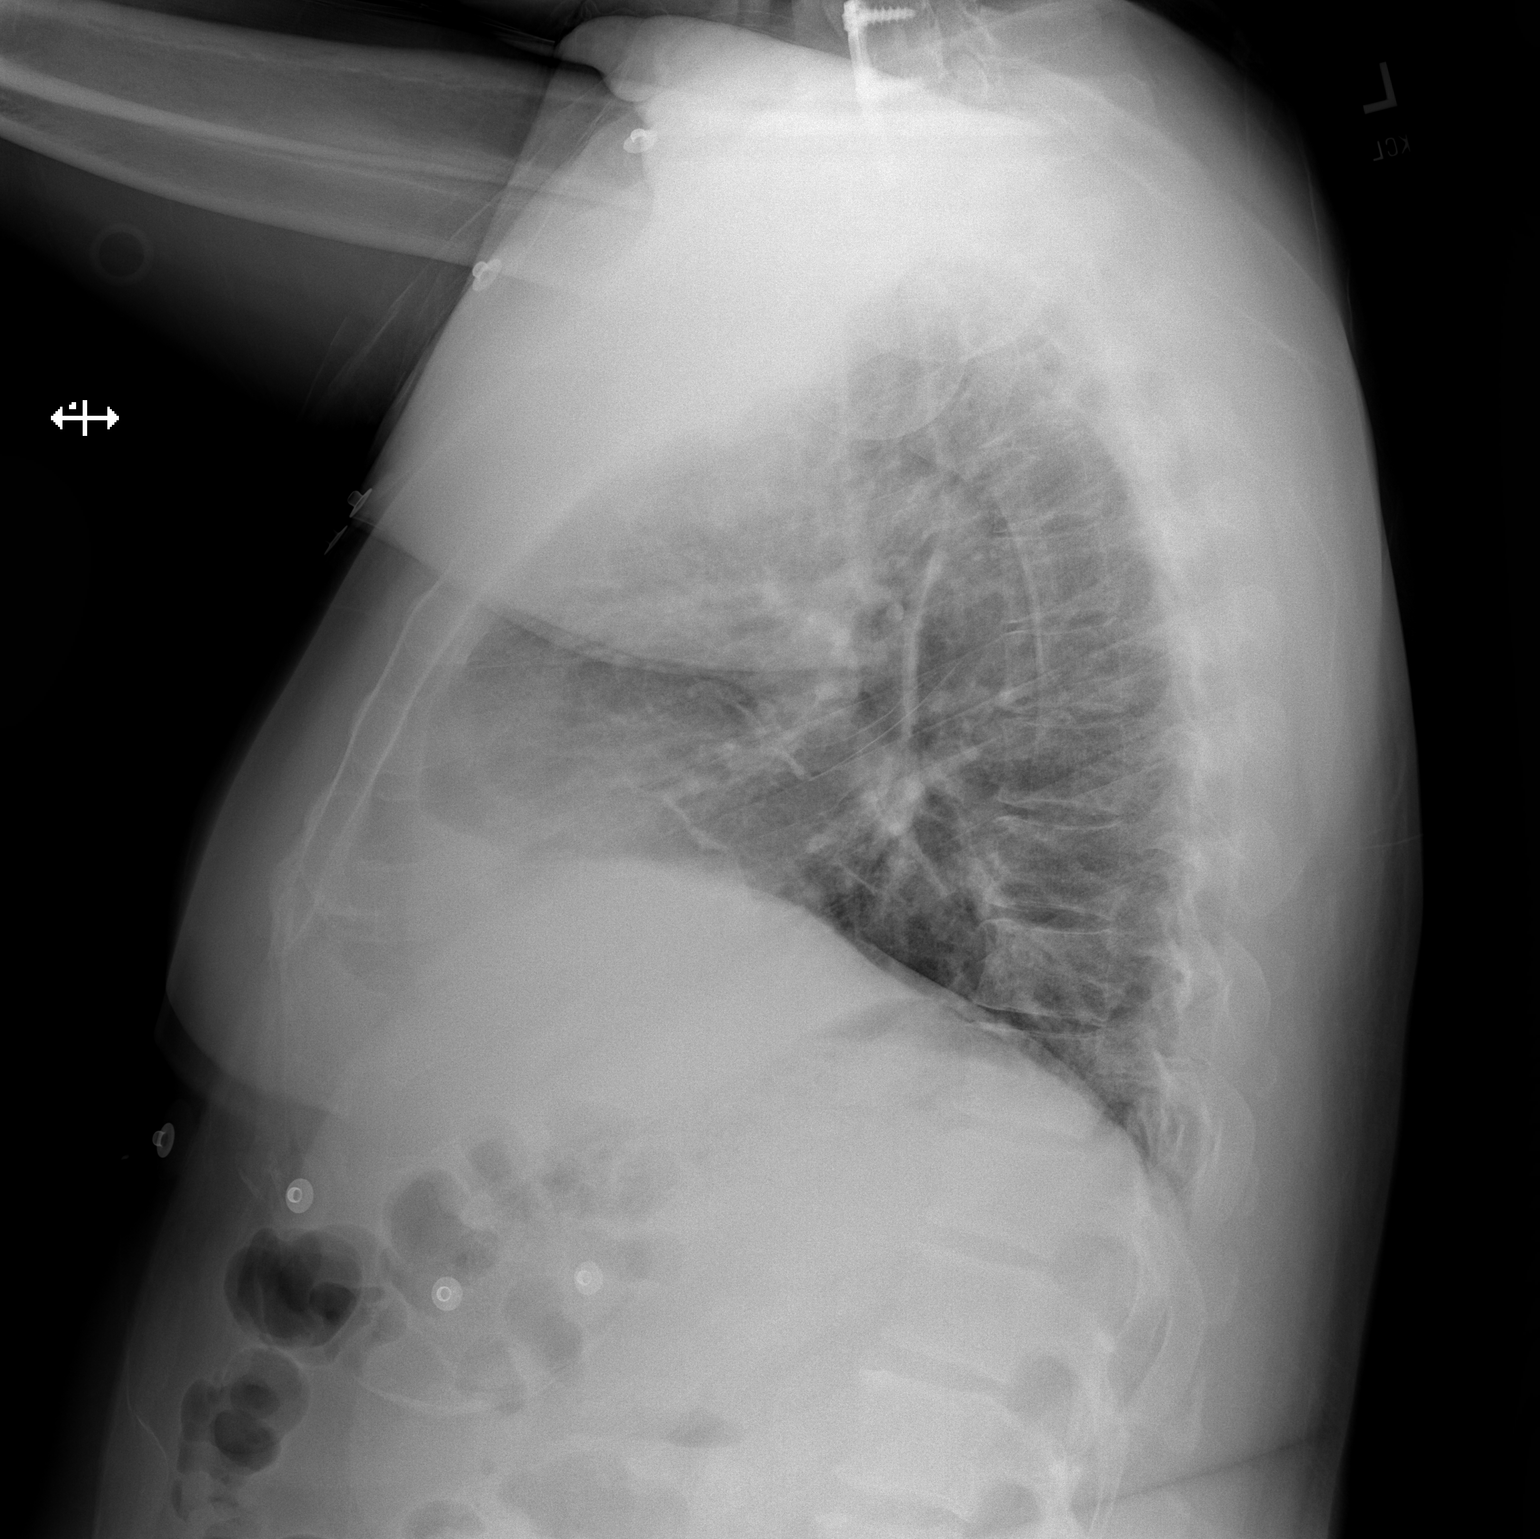

[2 of 2 positions shown; findings below may reference images not displayed]

FINDINGS: Stable normal cardiac silhouette given projection and technique.
Ill-defined opacity the left lung base. No pleural effusion or
pneumothorax. Anterior cervical fusion hardware noted. Bones are
otherwise unremarkable.
IMPRESSION: Ill-defined opacity in left lung base may represent atelectasis or
pneumonia.

By: Rantona Bhebhe M.D.

## 2019-03-13 IMAGING — CR DG ABDOMEN 2V
3 series · 3 of 3 positions shown · non-contrast
Comparison: None.

CLINICAL DATA: Sudden onset epigastric pain and nausea. History of
reflux.

EXAM:
ABDOMEN - 2 VIEW

[w abdomen upright]
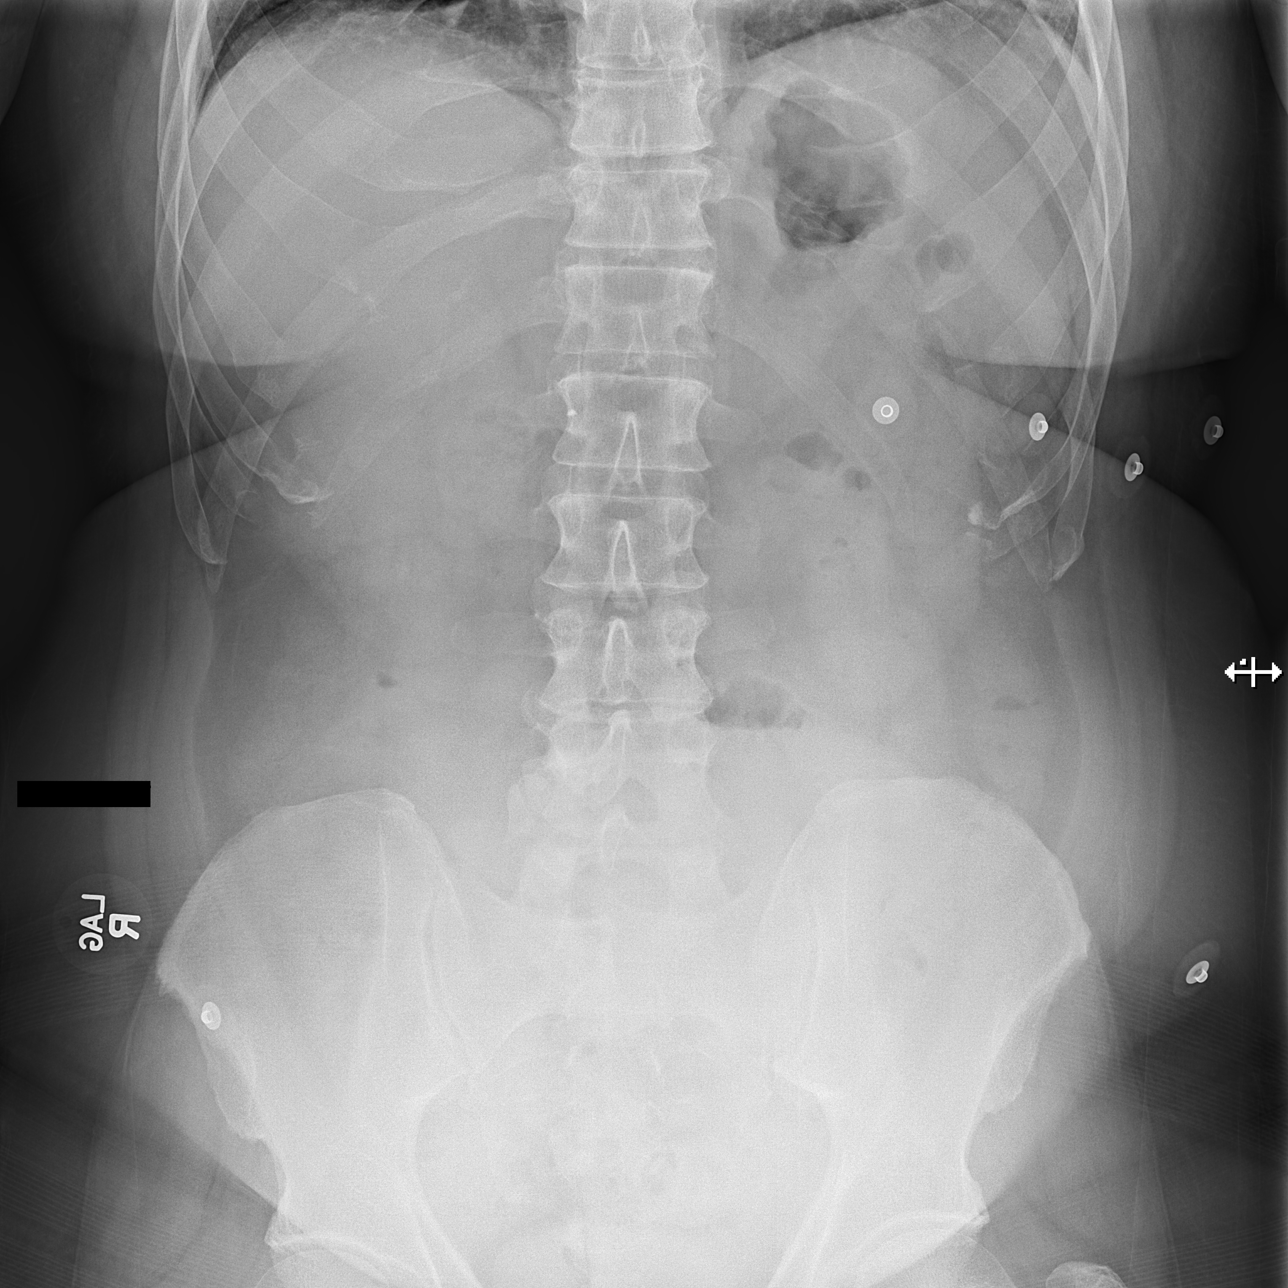

[t abdomen supine (1 of 2)]
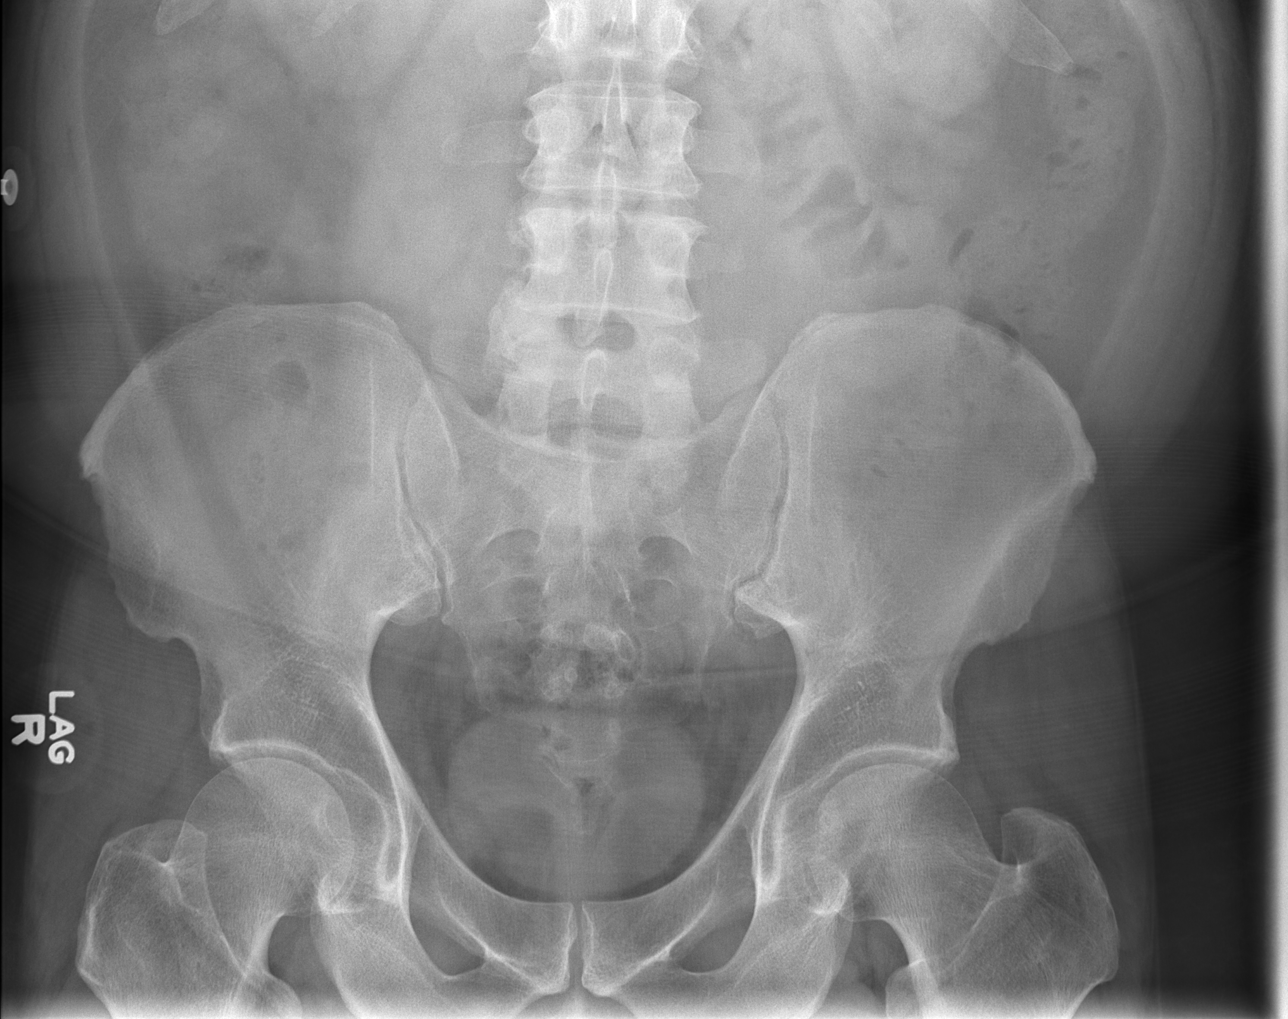

[t abdomen supine (2 of 2)]
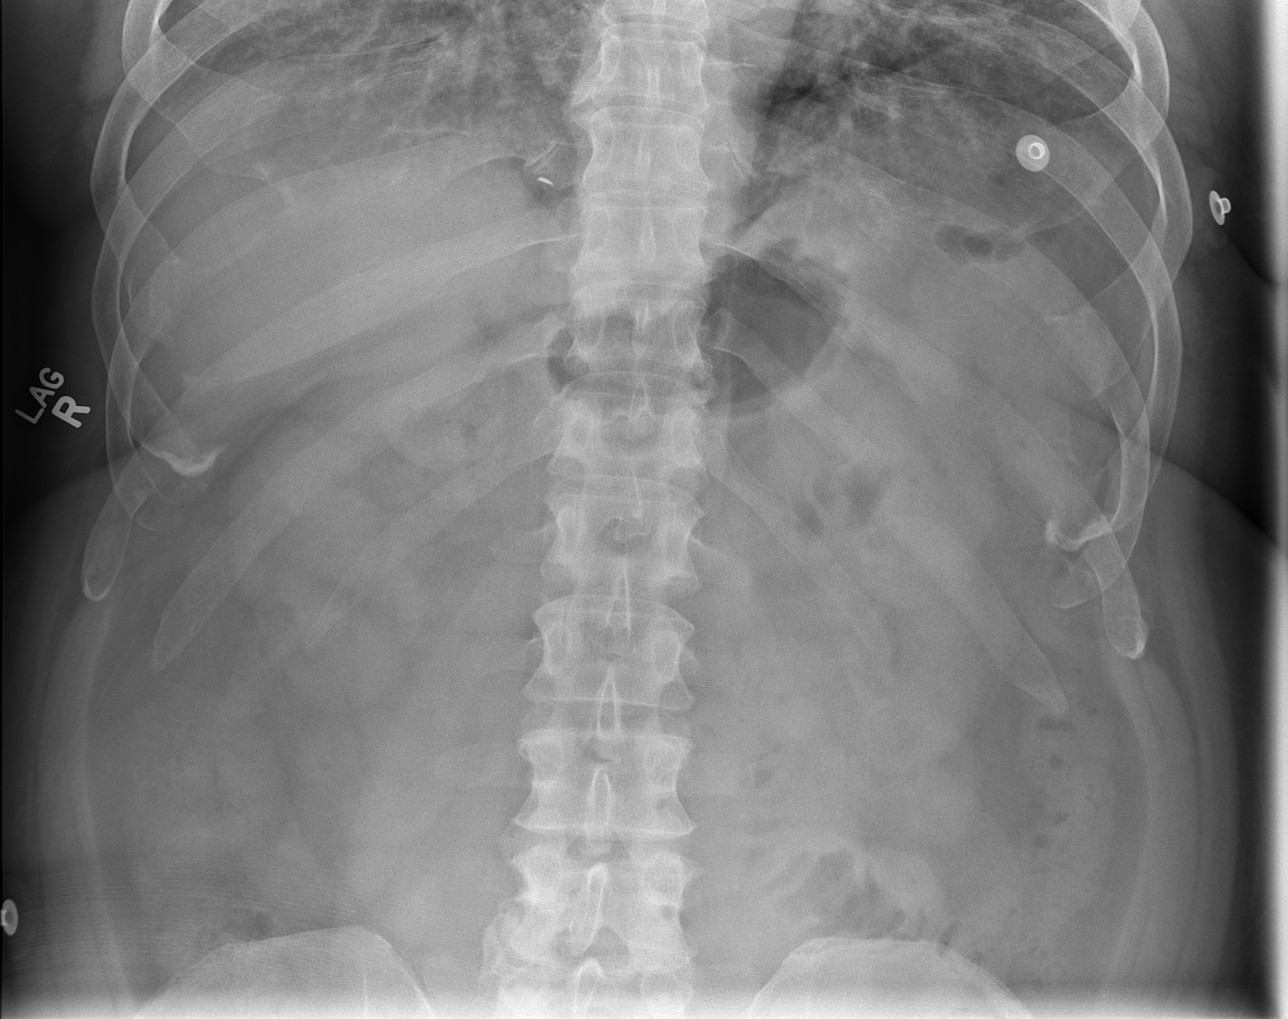

[3 of 3 positions shown; findings below may reference images not displayed]

FINDINGS: Moderate fecal retention within the colon. Lack of intraluminal
bowel gas limits further assessment. No organomegaly there is no
evidence of free air. No radio-opaque calculi. Facet arthropathy at
L3-4 and L4-5 on the right with facet hypertrophy and sclerosis.
IMPRESSION: Moderate fecal stool burden. Relative paucity of small bowel gas
limits further assessment.

## 2019-11-16 ENCOUNTER — Other Ambulatory Visit: Payer: Self-pay

## 2019-11-16 ENCOUNTER — Emergency Department (HOSPITAL_COMMUNITY)
Admission: EM | Admit: 2019-11-16 | Discharge: 2019-11-17 | Disposition: A | Discharge status: Home / Self Care | Payer: Self-pay | Attending: Emergency Medicine | Admitting: Emergency Medicine

## 2019-11-16 ENCOUNTER — Encounter (HOSPITAL_COMMUNITY): Payer: Self-pay | Admitting: Emergency Medicine

## 2019-11-16 DIAGNOSIS — Z79899 Other long term (current) drug therapy: Secondary | ICD-10-CM | POA: Insufficient documentation

## 2019-11-16 DIAGNOSIS — R1013 Epigastric pain: Secondary | ICD-10-CM | POA: Insufficient documentation

## 2019-11-16 DIAGNOSIS — F17210 Nicotine dependence, cigarettes, uncomplicated: Secondary | ICD-10-CM | POA: Insufficient documentation

## 2019-11-16 HISTORY — DX: Peptic ulcer, site unspecified, unspecified as acute or chronic, without hemorrhage or perforation: K27.9

## 2019-11-16 LAB — URINALYSIS, ROUTINE W REFLEX MICROSCOPIC
Bilirubin Urine: NEGATIVE
Glucose, UA: NEGATIVE mg/dL
Hgb urine dipstick: NEGATIVE
Ketones, ur: NEGATIVE mg/dL
Leukocytes,Ua: NEGATIVE
Nitrite: NEGATIVE
Protein, ur: NEGATIVE mg/dL
Specific Gravity, Urine: 1.017 (ref 1.005–1.030)
pH: 5 (ref 5.0–8.0)

## 2019-11-16 LAB — COMPREHENSIVE METABOLIC PANEL
ALT: 20 U/L (ref 0–44)
AST: 21 U/L (ref 15–41)
Albumin: 4.8 g/dL (ref 3.5–5.0)
Alkaline Phosphatase: 82 U/L (ref 38–126)
Anion gap: 10 (ref 5–15)
BUN: 23 mg/dL — ABNORMAL HIGH (ref 6–20)
CO2: 27 mmol/L (ref 22–32)
Calcium: 9.4 mg/dL (ref 8.9–10.3)
Chloride: 100 mmol/L (ref 98–111)
Creatinine, Ser: 0.87 mg/dL (ref 0.61–1.24)
GFR calc Af Amer: >60 mL/min (ref >60)
GFR calc non Af Amer: >60 mL/min (ref >60)
Glucose, Bld: 112 mg/dL — ABNORMAL HIGH (ref 70–99)
Potassium: 4.2 mmol/L (ref 3.5–5.1)
Sodium: 137 mmol/L (ref 135–145)
Total Bilirubin: 0.3 mg/dL (ref 0.3–1.2)
Total Protein: 8.3 g/dL — ABNORMAL HIGH (ref 6.5–8.1)

## 2019-11-16 LAB — CBC
HCT: 49.8 % (ref 39.0–52.0)
Hemoglobin: 16.1 g/dL (ref 13.0–17.0)
MCH: 29 pg (ref 26.0–34.0)
MCHC: 32.3 g/dL (ref 30.0–36.0)
MCV: 89.7 fL (ref 80.0–100.0)
Platelets: 257 10*3/uL (ref 150–400)
RBC: 5.55 MIL/uL (ref 4.22–5.81)
RDW: 13.1 % (ref 11.5–15.5)
WBC: 12.6 10*3/uL — ABNORMAL HIGH (ref 4.0–10.5)
nRBC: 0 % (ref 0.0–0.2)

## 2019-11-16 LAB — LIPASE, BLOOD: Lipase: 29 U/L (ref 11–51)

## 2019-11-16 MED ORDER — SODIUM CHLORIDE 0.9% FLUSH: 3.0000 mL | Freq: Once | INTRAVENOUS | Status: DC

## 2019-11-16 NOTE — ED Triage Notes (Signed)
Patient is complaining of upper abdominal pain. Patient has a hx of stomach ulcers. Patient states he usually get a gi cocktail and it clears it up.

## 2019-11-17 ENCOUNTER — Encounter (HOSPITAL_COMMUNITY): Payer: Self-pay | Admitting: Emergency Medicine

## 2019-11-17 MED ORDER — OMEPRAZOLE 40 MG PO CPDR: 40.0000 mg | DELAYED_RELEASE_CAPSULE | Freq: Every day | ORAL | 0 refills | Status: DC

## 2019-11-17 MED ORDER — SUCRALFATE 1 G PO TABS: 1.0000 g | ORAL_TABLET | Freq: Three times a day (TID) | ORAL | 0 refills | Status: DC

## 2019-11-17 MED ORDER — ALUM & MAG HYDROXIDE-SIMETH 200-200-20 MG/5ML PO SUSP
30.0000 mL | Freq: Once | ORAL | Status: AC
  Administered 2019-11-17: 30 mL via ORAL
  Filled 2019-11-17: qty 30

## 2019-11-17 MED ORDER — SUCRALFATE 1 GM/10ML PO SUSP
1.0000 g | Freq: Once | ORAL | Status: AC
  Administered 2019-11-17: 02:00:00 1 g via ORAL
  Filled 2019-11-17: qty 10

## 2019-11-17 MED ORDER — LIDOCAINE VISCOUS HCL 2 % MT SOLN
15.0000 mL | Freq: Once | OROMUCOSAL | Status: AC
  Administered 2019-11-17: 15 mL via OROMUCOSAL
  Filled 2019-11-17: qty 15

## 2019-11-17 MED ORDER — PANTOPRAZOLE SODIUM 40 MG PO TBEC
40.0000 mg | DELAYED_RELEASE_TABLET | Freq: Once | ORAL | Status: AC
  Administered 2019-11-17: 40 mg via ORAL
  Filled 2019-11-17: qty 1

## 2019-11-17 NOTE — ED Provider Notes (Signed)
[facility] DEPT Provider Note: Georgena Spurling, MD, FACEP  CSN: [id number] MRN: [id number] ARRIVAL: 11/16/19 at 2159 ROOM: WA12/WA12   CHIEF COMPLAINT  Abdominal Pain   HISTORY OF PRESENT ILLNESS  11/17/19 1:40 AM Tion Tse is a 53 y.o. male with a history of peptic ulcer disease.  He is here with an episode of heartburn that began yesterday evening about 8 PM.  He thinks he may have over eaten.  He rated the severity is a 4 out of 10.  He got partial relief with a GI cocktail in triage.  He is currently on omeprazole 20 mg daily.  He has not been on Carafate.  He denies nausea, vomiting or diarrhea.   Past Medical History:  Diagnosis Date  . PUD (peptic ulcer disease)     Past Surgical History:  Procedure Laterality Date  . CERVICAL DISCECTOMY    . MIDDLE EAR SURGERY      History reviewed. No pertinent family history.  Social History   Tobacco Use  . Smoking status: Current Every Day Smoker    Types: Cigarettes  . Smokeless tobacco: Never Used  Substance Use Topics  . Alcohol use: Yes  . Drug use: Yes    Types: Marijuana    Prior to Admission medications   Medication Sig Start Date End Date Taking? Authorizing Provider  asenapine (SAPHRIS) 5 MG SUBL 24 hr tablet Place 5 mg under the tongue at bedtime.    [provider]  benztropine (COGENTIN) 0.5 MG tablet Take 0.5 mg by mouth 2 (two) times daily.    [provider]  citalopram (CELEXA) 10 MG tablet Take 10 mg by mouth daily.    [provider]  nitroGLYCERIN (NITROSTAT) 0.4 MG SL tablet Place 0.4 mg under the tongue every 5 (five) minutes as needed for chest pain.    [provider]  omeprazole (PRILOSEC) 40 MG capsule Take 1 capsule (40 mg total) by mouth daily. 11/17/19   Beckie Viscardi, Aayden, MD  sucralfate (CARAFATE) 1 g tablet Take 1 tablet (1 g total) by mouth 4 (four) times daily -  with meals and at bedtime. 11/17/19   Mckinzy Fuller, Harrison, MD  vortioxetine HBr (TRINTELLIX) 10 MG  TABS tablet Take 10 mg by mouth daily.    [provider]    Allergies Patient has no known allergies.   REVIEW OF SYSTEMS  Negative except as noted here or in the History of Present Illness.   PHYSICAL EXAMINATION  Initial Vital Signs Blood pressure 133/82, pulse 81, temperature 98.5 F (36.9 C), temperature source Oral, resp. rate 17, height 5\' 10"  (1.778 m), weight 104.3 kg, SpO2 97 %.  Examination General: Well-developed, well-nourished male in no acute distress; appearance consistent with age of record HENT: normocephalic; atraumatic Eyes: pupils equal, round and reactive to light; extraocular muscles intact Neck: supple Heart: regular rate and rhythm Lungs: clear to auscultation bilaterally Abdomen: soft; nondistended; minimal epigastric tenderness; bowel sounds present Extremities: No deformity; full range of motion; pulses normal Neurologic: Awake, alert and oriented; motor function intact in all extremities and symmetric; no facial droop Skin: Warm and dry Psychiatric: Normal mood and affect   RESULTS  Summary of this visit's results, reviewed and interpreted by myself:   EKG Interpretation  Date/Time:    Ventricular Rate:    PR Interval:    QRS Duration:   QT Interval:    QTC Calculation:   R Axis:     Text Interpretation:  Laboratory Studies: Results for orders placed or performed during the hospital encounter of 11/16/19 (from the past 24 hour(s))  Lipase, blood     Status: None   Collection Time: 11/16/19 10:23 PM  Result Value Ref Range   Lipase 29 11 - 51 U/L  Comprehensive metabolic panel     Status: Abnormal   Collection Time: 11/16/19 10:23 PM  Result Value Ref Range   Sodium 137 135 - 145 mmol/L   Potassium 4.2 3.5 - 5.1 mmol/L   Chloride 100 98 - 111 mmol/L   CO2 27 22 - 32 mmol/L   Glucose, Bld 112 (H) 70 - 99 mg/dL   BUN 23 (H) 6 - 20 mg/dL   Creatinine, Ser 0.35 0.61 - 1.24 mg/dL   Calcium 9.4 8.9 - 59.7 mg/dL    Total Protein 8.3 (H) 6.5 - 8.1 g/dL   Albumin 4.8 3.5 - 5.0 g/dL   AST 21 15 - 41 U/L   ALT 20 0 - 44 U/L   Alkaline Phosphatase 82 38 - 126 U/L   Total Bilirubin 0.3 0.3 - 1.2 mg/dL   GFR calc non Af Amer >60 >60 mL/min   GFR calc Af Amer >60 >60 mL/min   Anion gap 10 5 - 15  CBC     Status: Abnormal   Collection Time: 11/16/19 10:23 PM  Result Value Ref Range   WBC 12.6 (H) 4.0 - 10.5 K/uL   RBC 5.55 4.22 - 5.81 MIL/uL   Hemoglobin 16.1 13.0 - 17.0 g/dL   HCT 41.6 38.4 - 53.6 %   MCV 89.7 80.0 - 100.0 fL   MCH 29.0 26.0 - 34.0 pg   MCHC 32.3 30.0 - 36.0 g/dL   RDW 46.8 03.2 - 12.2 %   Platelets 257 150 - 400 K/uL   nRBC 0.0 0.0 - 0.2 %  Urinalysis, Routine w reflex microscopic     Status: None   Collection Time: 11/16/19 10:23 PM  Result Value Ref Range   Color, Urine YELLOW YELLOW   APPearance CLEAR CLEAR   Specific Gravity, Urine 1.017 1.005 - 1.030   pH 5.0 5.0 - 8.0   Glucose, UA NEGATIVE NEGATIVE mg/dL   Hgb urine dipstick NEGATIVE NEGATIVE   Bilirubin Urine NEGATIVE NEGATIVE   Ketones, ur NEGATIVE NEGATIVE mg/dL   Protein, ur NEGATIVE NEGATIVE mg/dL   Nitrite NEGATIVE NEGATIVE   Leukocytes,Ua NEGATIVE NEGATIVE   Imaging Studies: No results found.  ED COURSE and MDM  Nursing notes, initial and subsequent vitals signs, including pulse oximetry, reviewed and interpreted by myself.  Vitals:   11/16/19 2214 11/16/19 2245 11/17/19 0017  BP: (!) 165/99  133/82  Pulse: 69  81  Resp: 16  17  Temp: 98.5 F (36.9 C)  98.5 F (36.9 C)  TempSrc: Oral  Oral  SpO2: 97%  97%  Weight:  104.3 kg   Height:  5\' 10"  (1.778 m)    Medications  sucralfate (CARAFATE) 1 GM/10ML suspension 1 g (has no administration in time range)  alum & mag hydroxide-simeth (MAALOX/MYLANTA) 200-200-20 MG/5ML suspension 30 mL (30 mLs Oral Given 11/17/19 0014)  lidocaine (XYLOCAINE) 2 % viscous mouth solution 15 mL (15 mLs Mouth/Throat Given 11/17/19 0014)  pantoprazole (PROTONIX) EC tablet  40 mg (40 mg Oral Given 11/17/19 0106)   Will increase patient's omeprazole to 40 mg daily and start a course of Carafate.  Will refer to gastroenterology for follow-up of symptoms persist.   PROCEDURES  Procedures  ED DIAGNOSES     ICD-10-CM   1. Epigastric pain  R10.13        Raia Amico, Jonny RuizJohn, MD 11/17/19 0150

## 2019-11-17 NOTE — ED Notes (Signed)
Pt was verbalized discharge instructions. Pt had no further questions at this time. NAD. 

## 2020-07-16 ENCOUNTER — Other Ambulatory Visit: Payer: Self-pay

## 2020-07-16 ENCOUNTER — Telehealth (HOSPITAL_COMMUNITY): Payer: No Payment, Other | Admitting: Psychiatric/Mental Health

## 2020-07-29 ENCOUNTER — Telehealth (HOSPITAL_COMMUNITY): Payer: No Payment, Other

## 2020-08-11 ENCOUNTER — Telehealth (HOSPITAL_COMMUNITY): Payer: Self-pay | Admitting: *Deleted

## 2020-08-11 NOTE — Telephone Encounter (Signed)
Call from patient stating he is out of his saphris and trintellex. He reports we pushed his appt back twice and because of it he is out. [facility] has covered his other meds that are not PAP but unable to do those as he is our patient, his first appt here is 9/12. Consulted with my supervisor for direction and he directed me to give him a walk in appt for this week. Called him back and left info for walk in appt tomorrow or thurs between 8-11 earlier the better and he would be worked. In.

## 2020-08-12 ENCOUNTER — Other Ambulatory Visit (HOSPITAL_COMMUNITY): Payer: Self-pay | Admitting: Psychiatry

## 2020-08-12 ENCOUNTER — Ambulatory Visit (INDEPENDENT_AMBULATORY_CARE_PROVIDER_SITE_OTHER): Payer: No Payment, Other | Admitting: Psychiatry

## 2020-08-12 ENCOUNTER — Encounter (HOSPITAL_COMMUNITY): Payer: Self-pay | Admitting: Psychiatry

## 2020-08-12 ENCOUNTER — Other Ambulatory Visit: Payer: Self-pay

## 2020-08-12 ENCOUNTER — Telehealth (HOSPITAL_COMMUNITY): Payer: Self-pay | Admitting: *Deleted

## 2020-08-12 DIAGNOSIS — F4310 Post-traumatic stress disorder, unspecified: Secondary | ICD-10-CM | POA: Diagnosis not present

## 2020-08-12 DIAGNOSIS — F3130 Bipolar disorder, current episode depressed, mild or moderate severity, unspecified: Secondary | ICD-10-CM

## 2020-08-12 DIAGNOSIS — G2401 Drug induced subacute dyskinesia: Secondary | ICD-10-CM

## 2020-08-12 DIAGNOSIS — F900 Attention-deficit hyperactivity disorder, predominantly inattentive type: Secondary | ICD-10-CM | POA: Diagnosis not present

## 2020-08-12 MED ORDER — VORTIOXETINE HBR 10 MG PO TABS: 10.0000 mg | ORAL_TABLET | Freq: Every day | ORAL | 2 refills | Status: DC

## 2020-08-12 MED ORDER — ATOMOXETINE HCL 40 MG PO CAPS: 40.0000 mg | ORAL_CAPSULE | Freq: Every day | ORAL | 2 refills | Status: DC

## 2020-08-12 MED ORDER — VORTIOXETINE HBR 20 MG PO TABS: 20.0000 mg | ORAL_TABLET | Freq: Every day | ORAL | 2 refills | Status: DC

## 2020-08-12 MED ORDER — ASENAPINE MALEATE 5 MG SL SUBL: 10.0000 mg | SUBLINGUAL_TABLET | Freq: Every day | SUBLINGUAL | 2 refills | Status: DC

## 2020-08-12 MED ORDER — PRAZOSIN HCL 1 MG PO CAPS: 3.0000 mg | ORAL_CAPSULE | Freq: Every day | ORAL | 2 refills | Status: DC

## 2020-08-12 MED ORDER — AUSTEDO 12 MG PO TABS: 18.0000 mg | ORAL_TABLET | Freq: Two times a day (BID) | ORAL | 30 refills | Status: DC

## 2020-08-12 MED ORDER — TRAZODONE HCL 50 MG PO TABS: 50.0000 mg | ORAL_TABLET | Freq: Every day | ORAL | 2 refills | Status: DC

## 2020-08-12 MED FILL — traZODone HCL 50 MG TABS: 50 | 30 days supply | Qty: 30 | Fill #0

## 2020-08-12 MED FILL — ATOMOXETINE HCL 40 MG CAP: 40 | 30 days supply | Qty: 30 | Fill #0

## 2020-08-12 NOTE — Telephone Encounter (Signed)
I have not heard back from [facility] in regard to availability of Strattera. I spoke with Dr Doyne Keel and she added Trazodone to his med regimen and wanted writer to inform him of that and to expect to pick it up when he goes to pick up other meds. Spoke with him and he plans to go by tomorrow for all of his needed meds on the assumption Strattera would not be available to be picked up till after 12 tomorrow as I don't see it on their formulary list. He verbalized his understanding of the Trazodone.

## 2020-08-12 NOTE — Progress Notes (Signed)
Psychiatric Initial Adult Assessment   Patient Identification: Fernando Garrison MRN:  [id number] Date of Evaluation:  08/12/2020 Referral Source: Walk in/ [facility] Chief Complaint:  "The medication regimen Im on now seems to be working" Visit Diagnosis:    ICD-10-CM   1. Attention deficit hyperactivity disorder (ADHD), predominantly inattentive type  F90.0 atomoxetine (STRATTERA) 40 MG capsule    DISCONTINUED: atomoxetine (STRATTERA) 40 MG capsule  2. Bipolar I disorder, most recent episode depressed (HCC)  F31.30 asenapine (SAPHRIS) 5 MG SUBL 24 hr tablet    vortioxetine HBr (TRINTELLIX) 20 MG TABS tablet    DISCONTINUED: vortioxetine HBr (TRINTELLIX) 10 MG TABS tablet    DISCONTINUED: asenapine (SAPHRIS) 5 MG SUBL 24 hr tablet    DISCONTINUED: vortioxetine HBr (TRINTELLIX) 10 MG TABS tablet  3. PTSD (post-traumatic stress disorder)  F43.10 prazosin (MINIPRESS) 1 MG capsule    DISCONTINUED: prazosin (MINIPRESS) 1 MG capsule  4. Tardive dyskinesia  G24.01 Deutetrabenazine (AUSTEDO) 12 MG TABS    DISCONTINUED: Deutetrabenazine (AUSTEDO) 12 MG TABS    History of Present Illness: 54 year old male seen today for initial psychiatric evaluation.  Patient walked into outpatient clinic for psychiatric evaluation and medication management.  He is a [facility] of [facility] and has a history of ADD, bipolar disorder, PTSD, OCD, and antisocial personality disorder.  He is currently being managed on prazosin 3 mg at bedtime, Trintellix 20 mg daily, Saphris 10 mg at bedtime and Cogentin 2 mg daily.  He notes that Cogentin is somewhat effective in managing his TD however notes that he still experiences rocking and occasional pill-rolling of his fingers.  Patient endorses symptoms of anxiety and depression.  He notes that he is depressed most days, has insomnia (noting that goes without sleep for 2 to 3 days at times however reports that last night he slept 6 to 7 hours), feelings of worthlessness, difficulty  concentrating, hopelessness, anxiety, and weight gain.  He notes that his anxiety and depression started spiraling out of control in July 2020 when his baby brother died in a tragic accident.  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  Patient notes that he has been having a difficult time concentrating.  Hr notes that he is distractible, forgetful, disorganized, and avoids tasks that require a lot of mental effort.  He notes that when he goes inside the grocery store for his mother he has to have a specific list.  He gave the example of having the exact brand of peanut butter.  He notes that if list are not exact he begins to have panic attacks which he notes are embarrassing.  He states that he avoids social crowds because he fears having a panic attack.  He also reports that he cares for his elderly parents.  He notes that he does not live with them but visit to help them with activities of daily living.  He notes that allow him to live in their storage shed.  He denies SI/HI/VH.  He notes that he has auditory hallucinations.  He states that he hears a lawn more in the distance, a radio playing, muffled voices.  Patient also informed provider that he has PTSD from being in jail for 16 years.  He notes that he witnessed murders, self-mutilation and suicide.  He also reports that spent 9 months in solitary confinement.  He reports it  is past trauma caused him to have nightmares.  Patient notes that he has constant rocking, and pill like rolling of his fingers.  Provider conducted an aims assessment on patient and noticed slight facial twitching and rocking.  Patient notes that it is somewhat effective however it does not help his condition.  He is a agreeable to start  Strattera 40 mg to help improve concentration.  Also agreeable to starting Austedo to improve symptoms of tardive  dyskinesia and discontinue Cogentin.Potential side effects of medication and risks vs benefits of treatment vs non-treatment were explained and discussed. All questions were answered.Patient given a sample of Austedo and Trintellix.  He will continue all other medications as prescribed.  No other concerns noted at this time Associated Signs/Symptoms: Depression Symptoms:  depressed mood, insomnia, fatigue, feelings of worthlessness/guilt, difficulty concentrating, hopelessness, anxiety, loss of energy/fatigue, weight gain, (Hypo) Manic Symptoms:  Distractibility, Elevated Mood, Hallucinations, Anxiety Symptoms:  Social Anxiety, Psychotic Symptoms:  Hallucinations: Auditory PTSD Symptoms: Had a traumatic exposure:  Notes that he was in jail for 16 years and have witnessed murders, self mutalisation, and suicide  Past Psychiatric History: Patient reports he has a history of ADD, bipolar disorder, PTSD, OCD, and antisocial personality disorder   Previous Psychotropic Medications: Yes trialed remeron, zoloft, and celexa  Substance Abuse History in the last 12 months:  Yes.    Consequences of Substance Abuse: Legal Consequences:  Convicted of possession of methamphetamine  Past Medical History:  Past Medical History:  Diagnosis Date  . PUD (peptic ulcer disease)     Past Surgical History:  Procedure Laterality Date  . CERVICAL DISCECTOMY    . MIDDLE EAR SURGERY      Family Psychiatric History: Mother adopted unknown mental issues. Niece ADHD, nephew autism, nephew schizophrenia and ADHD,  Family History: History reviewed. No pertinent family history.  Social History:   Social History   Socioeconomic History  . Marital status: Single    Spouse name: Not on file  . Number of children: Not on file  . Years of education: Not on file  . Highest education level: Not on file  Occupational History  . Not on file  Tobacco Use  . Smoking status: Current Every Day Smoker     Types: Cigarettes  . Smokeless tobacco: Never Used  Substance and Sexual Activity  . Alcohol use: Yes  . Drug use: Yes    Types: Marijuana  . Sexual activity: Not on file  Other Topics Concern  . Not on file  Social History Narrative  . Not on file   Social Determinants of Health   Financial Resource Strain:   . Difficulty of Paying Living Expenses: Not on file  Food Insecurity:   . Worried About Programme researcher, broadcasting/film/video in the Last Year: Not on file  . Ran Out of Food in the Last Year: Not on file  Transportation Needs:   . Lack of Transportation (Medical): Not on file  . Lack of Transportation (Non-Medical): Not on file  Physical Activity:   . Days of Exercise per Week: Not on file  . Minutes of Exercise per Session: Not on file  Stress:   . Feeling of Stress : Not on file  Social Connections:   . Frequency of Communication with Friends and Family: Not on file  . Frequency of Social Gatherings with Friends and Family: Not on file  . Attends Religious Services: Not on file  . Active Member of Clubs or Organizations: Not on  file  . Attends Banker Meetings: Not on file  . Marital Status: Not on file    Additional Social History: Patient resides in [place].  He is currently single.  He has 2 children.  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  He has occasional alcohol use Allergies:  No Known Allergies  Metabolic Disorder Labs: No results found for: HGBA1C, MPG No results found for: PROLACTIN No results found for: CHOL, TRIG, HDL, CHOLHDL, VLDL, LDLCALC No results found for: TSH  Therapeutic Level Labs: No results found for: LITHIUM No results found for: CBMZ No results found for: VALPROATE  Current Medications: Current Outpatient Medications  Medication Sig Dispense Refill  . asenapine (SAPHRIS) 5 MG SUBL 24 hr tablet Place 2 tablets (10 mg total) under the tongue at bedtime. 60 tablet 2  . atomoxetine (STRATTERA) 40 MG  capsule Take 1 capsule (40 mg total) by mouth daily. 30 capsule 2  . Deutetrabenazine (AUSTEDO) 12 MG TABS Take 18 mg by mouth in the morning and at bedtime. 60 tablet 30  . nitroGLYCERIN (NITROSTAT) 0.4 MG SL tablet Place 0.4 mg under the tongue every 5 (five) minutes as needed for chest pain.    Marland Kitchen omeprazole (PRILOSEC) 40 MG capsule Take 1 capsule (40 mg total) by mouth daily. 30 capsule 0  . prazosin (MINIPRESS) 1 MG capsule Take 3 capsules (3 mg total) by mouth at bedtime. 90 capsule 2  . sucralfate (CARAFATE) 1 g tablet Take 1 tablet (1 g total) by mouth 4 (four) times daily -  with meals and at bedtime. 40 tablet 0  . traZODone (DESYREL) 50 MG tablet Take 1 tablet (50 mg total) by mouth at bedtime. 30 tablet 2  . vortioxetine HBr (TRINTELLIX) 20 MG TABS tablet Take 1 tablet (20 mg total) by mouth daily. 30 tablet 2   No current facility-administered medications for this visit.    Musculoskeletal: Strength & Muscle Tone: within normal limits Gait & Station: normal Patient leans: N/A  Psychiatric Specialty Exam: Review of Systems  There were no vitals taken for this visit.There is no height or weight on file to calculate BMI.  General Appearance: Well Groomed  Eye Contact:  Good  Speech:  Clear and Coherent and Normal Rate  Volume:  Decreased  Mood:  Anxious and Depressed  Affect:  Congruent  Thought Process:  Coherent, Goal Directed and Linear  Orientation:  Full (Time, Place, and Person)  Thought Content:  Logical and Hallucinations: Auditory  Suicidal Thoughts:  No  Homicidal Thoughts:  No  Memory:  Immediate;   Good Recent;   Good Remote;   Good  Judgement:  Good  Insight:  Good  Psychomotor Activity:  TD  Concentration:  Concentration: Good and Attention Span: Good  Recall:  Good  Fund of Knowledge:Good  Language: Good  Akathisia:  No  Handed:  Right  AIMS (if indicated):  Done patient noted to have rocking, and slight facial twitches.  Assets:  Communication  Skills Desire for Improvement Social Support Transportation  ADL's:  Intact  Cognition: WNL  Sleep:  Poor   Screenings:   Assessment and Plan: Patient symptoms of anxiety, depression, poor concentration, and poor sleep.  He he notes that his anxiety and depression and is centered around life stressors and notes that he is able to cope with it.  He is agreeable to starting trazodone 50 mg to help improve sleep.  He is also agreeable to starting Strattera  40 mg to help improve concentration.  Patient notes that his Cogentin does not help his TD and he is agreeable to discontinuing it and starting Strattera.  Patient was giving a sample of Strattera.  We will continue all other medications as prescribed.   Duwaine Maxin, NP 8/25/20213:10 PM

## 2020-08-30 ENCOUNTER — Telehealth (HOSPITAL_COMMUNITY): Payer: No Payment, Other | Admitting: Adult Health

## 2020-09-07 ENCOUNTER — Telehealth (HOSPITAL_COMMUNITY): Payer: Self-pay | Admitting: *Deleted

## 2020-09-07 ENCOUNTER — Telehealth: Payer: Self-pay

## 2020-09-07 MED FILL — traZODone HCL 50 MG TABS: 50 | 30 days supply | Qty: 30 | Fill #1

## 2020-09-07 MED FILL — TRINTELLIX 20 MG TABLET: 20 | 30 days supply | Qty: 30 | Fill #0

## 2020-09-07 MED FILL — SAPHRIS 5 MG TAB SL BLK CHE: 5 | 30 days supply | Qty: 60 | Fill #0

## 2020-09-07 MED FILL — PRAZOSIN 1 MG CAPSULE: 1 | 30 days supply | Qty: 90 | Fill #0

## 2020-09-07 NOTE — Telephone Encounter (Signed)
Walk in with paperwork for PAP and questions re his refills or medications. PAP is for all oral medications which is managed by [facility], Clinical research associate agreed to fax it to them for him. He needs refills but only med available to pick up and is free is Trazodone, which he says he has some supply of. Saphris, Austedo expensive and applying to PAP for, doesn't feel he is benefitted by the Trintellex, feels much better from Strattera, cant tell a difference with Trintellex.  ALso takes Minipress but we dont have samples of what he needs. Will follow up [facility] for his meds. He can call Latanya Presser. For more infor.

## 2020-09-07 NOTE — Telephone Encounter (Signed)
Copied from CRM [id number]. Topic: General - Other >> Sep 07, 2020 12:51 PM Dalphine Handing A wrote: Patient is requesting a callback from [facility] in regards to his application for financial assistance and wanting to know the turn around time and what medications he is eligible for financial assistance with. Please Francee Piccolo

## 2020-09-08 MED FILL — ATOMOXETINE HCL 40 MG CAPS: 40 | 30 days supply | Qty: 30 | Fill #1

## 2020-09-14 ENCOUNTER — Other Ambulatory Visit: Payer: Self-pay

## 2020-09-14 DIAGNOSIS — G2401 Drug induced subacute dyskinesia: Secondary | ICD-10-CM

## 2020-09-14 NOTE — Telephone Encounter (Signed)
Hi. Is this provider at your clinic, Gretchen Short, NP? Request came from [facility]. If not would you please route back to the [facility] triage pool. Thank you.

## 2020-09-14 NOTE — Telephone Encounter (Signed)
Medication Refill - Medication:  Deutetrabenazine (AUSTEDO) 12 MG TABS [[id number]]     Preferred Pharmacy (with phone number or street name):  [facility] - [place], [place] - [address]  [address] [place] [address]  Phone: [phone] Fax: [phone]  Hours: Not open 24 hours   Pt states this 90 day supply was not given to him at pharmacy   Agent: Please be advised that RX refills may take up to 3 business days. We ask that you follow-up with your pharmacy.

## 2020-09-25 ENCOUNTER — Telehealth (HOSPITAL_COMMUNITY): Payer: Self-pay | Admitting: *Deleted

## 2020-09-25 ENCOUNTER — Encounter (HOSPITAL_COMMUNITY): Payer: Self-pay | Admitting: Psychiatry

## 2020-09-25 ENCOUNTER — Other Ambulatory Visit (HOSPITAL_COMMUNITY): Payer: Self-pay | Admitting: Psychiatry

## 2020-09-25 ENCOUNTER — Other Ambulatory Visit: Payer: Self-pay

## 2020-09-25 ENCOUNTER — Ambulatory Visit (INDEPENDENT_AMBULATORY_CARE_PROVIDER_SITE_OTHER): Payer: No Payment, Other | Admitting: Psychiatry

## 2020-09-25 DIAGNOSIS — F4310 Post-traumatic stress disorder, unspecified: Secondary | ICD-10-CM | POA: Diagnosis not present

## 2020-09-25 DIAGNOSIS — G2401 Drug induced subacute dyskinesia: Secondary | ICD-10-CM | POA: Diagnosis not present

## 2020-09-25 DIAGNOSIS — F900 Attention-deficit hyperactivity disorder, predominantly inattentive type: Secondary | ICD-10-CM

## 2020-09-25 DIAGNOSIS — F411 Generalized anxiety disorder: Secondary | ICD-10-CM | POA: Insufficient documentation

## 2020-09-25 DIAGNOSIS — F4010 Social phobia, unspecified: Secondary | ICD-10-CM

## 2020-09-25 DIAGNOSIS — F3130 Bipolar disorder, current episode depressed, mild or moderate severity, unspecified: Secondary | ICD-10-CM

## 2020-09-25 MED ORDER — ASENAPINE MALEATE 5 MG SL SUBL: 10.0000 mg | SUBLINGUAL_TABLET | Freq: Every day | SUBLINGUAL | 2 refills | Status: DC

## 2020-09-25 MED ORDER — ATOMOXETINE HCL 40 MG PO CAPS: 40.0000 mg | ORAL_CAPSULE | Freq: Every day | ORAL | 2 refills | Status: DC

## 2020-09-25 MED ORDER — TRAZODONE HCL 50 MG PO TABS: 50.0000 mg | ORAL_TABLET | Freq: Every day | ORAL | 2 refills | Status: DC

## 2020-09-25 MED ORDER — PRAZOSIN HCL 1 MG PO CAPS: 3.0000 mg | ORAL_CAPSULE | Freq: Every day | ORAL | 2 refills | Status: DC

## 2020-09-25 MED ORDER — BENZTROPINE MESYLATE 0.5 MG PO TABS: 1.0000 mg | ORAL_TABLET | Freq: Two times a day (BID) | ORAL | 2 refills | Status: DC

## 2020-09-25 MED ORDER — AUSTEDO 12 MG PO TABS: 18.0000 mg | ORAL_TABLET | Freq: Two times a day (BID) | ORAL | 30 refills | Status: DC

## 2020-09-25 MED ORDER — VORTIOXETINE HBR 20 MG PO TABS: 20.0000 mg | ORAL_TABLET | Freq: Every day | ORAL | 2 refills | Status: DC

## 2020-09-25 MED ORDER — BUSPIRONE HCL 10 MG PO TABS: 10.0000 mg | ORAL_TABLET | Freq: Three times a day (TID) | ORAL | 2 refills | Status: DC

## 2020-09-25 MED FILL — PRAZOSIN 1 MG CAPSULE: 1 | 30 days supply | Qty: 90 | Fill #0

## 2020-09-25 MED FILL — traZODone HCL 50 MG TABS: 50 | 30 days supply | Qty: 30 | Fill #0

## 2020-09-25 MED FILL — busPIRone HCL 10 MG TABS: 10 | 30 days supply | Qty: 90 | Fill #0

## 2020-09-25 MED FILL — BENZTROPINE MES 0.5 MG TAB: 0.5 | 15 days supply | Qty: 60 | Fill #0

## 2020-09-25 NOTE — Progress Notes (Signed)
BH MD/PA/NP OP Progress Note Virtual Visit via Telephone Note  I connected with Fernando Garrison on 09/25/20 at 10:30 AM EDT by telephone and verified that I am speaking with the correct person using two identifiers.  Location: Patient: home Provider: Clinic   I discussed the limitations, risks, security and privacy concerns of performing an evaluation and management service by telephone and the availability of in person appointments. I also discussed with the patient that there may be a patient responsible charge related to this service. The patient expressed understanding and agreed to proceed.   I provided 30 minutes of non-face-to-face time during this encounter.   09/25/2020 10:00 AM Fernando Garrison  MRN:  [id number]  Chief Complaint:" I can't afford the Austado until my patient assistance is approved"    HPI: [name] is a 54 year old male seen today for follow-up psychiatric evaluation. Patient has a history of OCD, ADD, bipolar 1, tardive dyskinesia, antisocial personality disorder, and PTSD and is currently managed on Straterra 40mg  daily, Saphris 10mg  at bedtime, Austedo 18mg  twice daily, Trintellix 20mg  daily, Trazodone 50mg  at night as needed, and Prazosin 3mg  at bedtime.  Visit was completed via telephone.  On evaluation today, patient is pleasant, calm, and cooperative. His speech is at a normal rate and volume. He states that he has been out of Austedo for 1 month and he cannot afford it without the patient assistance program, which he does not have yet. He has been taking Cogentin instead, which helps with his tardive dyskinesia but is "not as good as the Austedo."  He endorses feeling depressed that he rates a 4/10 [with 10 being most severe] and anxiety at a 2/10 [with 10 being most severe] but states that it can get up to a 8/10. He states that his anxiety is always present but is exacerbated by being in crowded places such as the grocery store. He endorses feelings that something bad  will happen or that he will become embarrassed, and will constantly look for the exit.   Patient is agreeable to start Buspar 10mg  three times per day, restart Cogentin to 0.5mg  twice daily, and continue all other medications as prescribed. When he is able to obtain patient assistance he agrees to discontinue Cogentin and start Austedo 18 mg twice daily. He also agrees to follow-up with outpatient psychiatry in 3 months. No other questions or concerns noted at this time.  Visit Diagnosis:     ICD-10-CM   1. Attention deficit hyperactivity disorder (ADHD), predominantly inattentive type  F90.0 atomoxetine (STRATTERA) 40 MG capsule  2. Tardive dyskinesia  G24.01 Deutetrabenazine (AUSTEDO) 12 MG TABS    benztropine (COGENTIN) 0.5 MG tablet  3. PTSD (post-traumatic stress disorder)  F43.10 prazosin (MINIPRESS) 1 MG capsule  4. Bipolar I disorder, most recent episode depressed (HCC)  F31.30 vortioxetine HBr (TRINTELLIX) 20 MG TABS tablet    asenapine (SAPHRIS) 5 MG SUBL 24 hr tablet  5. Social anxiety disorder  F40.10 busPIRone (BUSPAR) 10 MG tablet    Past Psychiatric History: Patient reports he has a history of ADD, bipolar disorder, PTSD, OCD, and antisocial personality disorder   Past Medical History:  Past Medical History:  Diagnosis Date  . PUD (peptic ulcer disease)     Past Surgical History:  Procedure Laterality Date  . CERVICAL DISCECTOMY    . MIDDLE EAR SURGERY      Family Psychiatric History: Mother adopted unknown mental issues. Niece ADHD, nephew autism, nephew schizophrenia and ADHD,  Family History: No  family history on file.  Social History:  Social History   Socioeconomic History  . Marital status: Single    Spouse name: Not on file  . Number of children: Not on file  . Years of education: Not on file  . Highest education level: Not on file  Occupational History  . Not on file  Tobacco Use  . Smoking status: Current Every Day Smoker    Types: Cigarettes  .  Smokeless tobacco: Never Used  Substance and Sexual Activity  . Alcohol use: Yes  . Drug use: Yes    Types: Marijuana  . Sexual activity: Not on file  Other Topics Concern  . Not on file  Social History Narrative  . Not on file   Social Determinants of Health   Financial Resource Strain:   . Difficulty of Paying Living Expenses: Not on file  Food Insecurity:   . Worried About Programme researcher, broadcasting/film/video in the Last Year: Not on file  . Ran Out of Food in the Last Year: Not on file  Transportation Needs:   . Lack of Transportation (Medical): Not on file  . Lack of Transportation (Non-Medical): Not on file  Physical Activity:   . Days of Exercise per Week: Not on file  . Minutes of Exercise per Session: Not on file  Stress:   . Feeling of Stress : Not on file  Social Connections:   . Frequency of Communication with Friends and Family: Not on file  . Frequency of Social Gatherings with Friends and Family: Not on file  . Attends Religious Services: Not on file  . Active Member of Clubs or Organizations: Not on file  . Attends Banker Meetings: Not on file  . Marital Status: Not on file    Allergies: No Known Allergies  Metabolic Disorder Labs: No results found for: HGBA1C, MPG No results found for: PROLACTIN No results found for: CHOL, TRIG, HDL, CHOLHDL, VLDL, LDLCALC No results found for: TSH  Therapeutic Level Labs: No results found for: LITHIUM No results found for: VALPROATE No components found for:  CBMZ  Current Medications: Current Outpatient Medications  Medication Sig Dispense Refill  . asenapine (SAPHRIS) 5 MG SUBL 24 hr tablet Place 2 tablets (10 mg total) under the tongue at bedtime. 60 tablet 2  . atomoxetine (STRATTERA) 40 MG capsule Take 1 capsule (40 mg total) by mouth daily. 30 capsule 2  . benztropine (COGENTIN) 0.5 MG tablet Take 2 tablets (1 mg total) by mouth 2 (two) times daily. 60 tablet 2  . busPIRone (BUSPAR) 10 MG tablet Take 1 tablet  (10 mg total) by mouth 3 (three) times daily. 90 tablet 2  . Deutetrabenazine (AUSTEDO) 12 MG TABS Take 18 mg by mouth in the morning and at bedtime. 60 tablet 30  . nitroGLYCERIN (NITROSTAT) 0.4 MG SL tablet Place 0.4 mg under the tongue every 5 (five) minutes as needed for chest pain.    Marland Kitchen omeprazole (PRILOSEC) 40 MG capsule Take 1 capsule (40 mg total) by mouth daily. 30 capsule 0  . prazosin (MINIPRESS) 1 MG capsule Take 3 capsules (3 mg total) by mouth at bedtime. 90 capsule 2  . sucralfate (CARAFATE) 1 g tablet Take 1 tablet (1 g total) by mouth 4 (four) times daily -  with meals and at bedtime. 40 tablet 0  . traZODone (DESYREL) 50 MG tablet Take 1 tablet (50 mg total) by mouth at bedtime. 30 tablet 2  . vortioxetine HBr (TRINTELLIX) 20  MG TABS tablet Take 1 tablet (20 mg total) by mouth daily. 30 tablet 2   No current facility-administered medications for this visit.     Musculoskeletal: Strength & Muscle Tone: Unable to assess due to telephone visit Gait & Station: Unable to assess due to telephone visit Patient leans: N/A  Psychiatric Specialty Exam: Review of Systems  There were no vitals taken for this visit.There is no height or weight on file to calculate BMI.  General Appearance: Unable to assess due to telephone visit  Eye Contact:  Unable to assess due to telephone visit  Speech:  Clear and Coherent and Normal Rate  Volume:  Normal  Mood:  Euthymic  Affect:  Appropriate and Congruent  Thought Process:  Coherent, Goal Directed and Linear  Orientation:  Full (Time, Place, and Person)  Thought Content: WDL and Logical   Suicidal Thoughts:  No  Homicidal Thoughts:  No  Memory:  Immediate;   Good Recent;   Good Remote;   Good  Judgement:  Good  Insight:  Good  Psychomotor Activity:  Normal  Concentration:  Concentration: Good and Attention Span: Good  Recall:  Good  Fund of Knowledge: Good  Language: Good  Akathisia:  No  Handed:  Right  AIMS (if indicated):  Notes  Assets:  Communication Skills Desire for Improvement Financial Resources/Insurance Housing Social Support  ADL's:  Intact  Cognition: WNL  Sleep:  Good   Screenings:   Assessment and Plan: Patient notes that overall he is doing well.  He notes that at times he feels anxious and depressed especially in social settings.  He is agreeable to starting BuSpar 10 mg 3 times daily to help manage symptoms of anxiety.  Patient also informed provider that he is unable to afford his Austedo until his patient assistance is approved.  He notes that he will call them today and follow-up on his approval.  He is agreeable to take Cogentin 0.5 mg twice daily as needed for TD, discontinue it once he is able to afford Austedo, and takeAustedo18 mg twice daily.  He will continue all other medications as prescribed.  1. Attention deficit hyperactivity disorder (ADHD), predominantly inattentive type  Continue- atomoxetine (STRATTERA) 40 MG capsule; Take 1 capsule (40 mg total) by mouth daily.  Dispense: 30 capsule; Refill: 2  2. Tardive dyskinesia  Continue- Deutetrabenazine (AUSTEDO) 12 MG TABS; Take 18 mg by mouth in the morning and at bedtime.  Dispense: 60 tablet; Refill: 30 Restart- benztropine (COGENTIN) 0.5 MG tablet; Take 2 tablets (1 mg total) by mouth 2 (two) times daily.  Dispense: 60 tablet; Refill: 2  3. PTSD (post-traumatic stress disorder)  Continue- prazosin (MINIPRESS) 1 MG capsule; Take 3 capsules (3 mg total) by mouth at bedtime.  Dispense: 90 capsule; Refill: 2  4. Bipolar I disorder, most recent episode depressed (HCC)  Continue- vortioxetine HBr (TRINTELLIX) 20 MG TABS tablet; Take 1 tablet (20 mg total) by mouth daily.  Dispense: 30 tablet; Refill: 2 Contine- asenapine (SAPHRIS) 5 MG SUBL 24 hr tablet; Place 2 tablets (10 mg total) under the tongue at bedtime.  Dispense: 60 tablet; Refill: 2  5. Social anxiety disorder  Start- busPIRone (BUSPAR) 10 MG tablet; Take 1 tablet  (10 mg total) by mouth 3 (three) times daily.  Dispense: 90 tablet; Refill: 2  Follow-up in 3 months  Shanna Cisco, NP 09/25/2020, 10:00 AM

## 2020-09-25 NOTE — Telephone Encounter (Signed)
Walk in with paperwork he needs faxed to [facility]. Faxed it for him. Also, he was given a website to fill out Austedo application for PAP but didn't know how to do it. Printed him the form, he completed his part while here and will finish is Monday when Ms Doyne Keel NP returns. He needed Austedo but no NP or Dr to ok me releasing samples and all we have in samples are start packs and it would require me cobbling up a start pack to make his 18mg  BID dose and then it would only cover a few days. Told him to call me mon when I have access to NP to see what can be done re his Austedo. States he has been out for weeks and has been doubling up on his Cogentin to get by but its not as effective.

## 2020-09-28 ENCOUNTER — Telehealth (HOSPITAL_COMMUNITY): Payer: Self-pay | Admitting: *Deleted

## 2020-09-28 NOTE — Telephone Encounter (Signed)
Patient called to find out what was happening with his Austedo. Updated him on where things were at with it, I havent heard back from the specialty pharmacy yet and was waiting to hear before calling him back. Made him aware he had rx for Cogentin already called into [facility] for him to pick up while we work out the [employer].  Called [facility] to price it thru them and 9 mg, 18 mg doesn't exist it would 9000.00 for 120 pills. Spoke with the manager there and she can give him a free month coupon and then he will have to help [place] manage his medication thru PA program. The medicine goes directly to him once he gets accepted. Will let the provider know.

## 2020-09-28 NOTE — Telephone Encounter (Signed)
Writer spoke with patient on 09/25/2020 regarding him not being able to afford Austedo until his patient assistance is approved. Provider instructed patient to restart Cogentin 0.5 mg twice daily. He was also instructed to to discontinue cogentin after his patient care assistant was approved and restart Austedo 18 mg twice daily. He endorsed understanding and agreed. He also informed provider that he was in the process of filling out his application for patient care assistant which he plans to finish by Monday.

## 2020-10-13 MED FILL — BENZTROPINE MES 0.5 MG TAB: 0.5 | 15 days supply | Qty: 60 | Fill #1

## 2020-10-14 ENCOUNTER — Telehealth (HOSPITAL_COMMUNITY): Payer: Self-pay | Admitting: *Deleted

## 2020-10-14 NOTE — Telephone Encounter (Signed)
Consulted Toy Cookey NP re restart dose for his Austedo. Per NP he is able to restart at 18 mg BID. Info given to pharmacist Susie B at [phone]. He will get a 30 day supply with 11 refills.

## 2020-10-14 NOTE — Telephone Encounter (Signed)
Call from [employer], patient has been approved for patient assistance for his Austedo. Company calling on his behalf to see what dose dr wants to start him on since he has been off of it for two weeks pending approval. Will forward concern to Toy Cookey NP. To call [phone]

## 2020-10-14 NOTE — Telephone Encounter (Signed)
Provider called [employer] regarding Austedo. Provider informed [employer] that the patient could be restarted on Austedo 18 mg twice daily. Provider noted that patient did not have side effects on medication. They endorsed understanding. No other concerns noted at this time.

## 2020-10-30 MED FILL — busPIRone HCL 10 MG TABS: 10 | 30 days supply | Qty: 90 | Fill #1

## 2020-11-11 MED FILL — traZODone HCL 50 MG TABS: 50 | 30 days supply | Qty: 30 | Fill #1

## 2020-11-11 MED FILL — PRAZOSIN 1 MG CAPSULE: 1 | 30 days supply | Qty: 90 | Fill #1

## 2020-12-09 MED FILL — PRAZOSIN 1 MG CAPSULE: 1 | 30 days supply | Qty: 90 | Fill #2

## 2020-12-09 MED FILL — traZODone HCL 50 MG TABS: 50 | 30 days supply | Qty: 30 | Fill #2

## 2020-12-24 ENCOUNTER — Telehealth (HOSPITAL_COMMUNITY): Payer: Self-pay

## 2020-12-24 ENCOUNTER — Encounter (HOSPITAL_COMMUNITY): Payer: Self-pay | Admitting: Psychiatry

## 2020-12-24 NOTE — Telephone Encounter (Signed)
Letter written and placed in Mychart.

## 2020-12-28 ENCOUNTER — Encounter (HOSPITAL_COMMUNITY): Payer: No Payment, Other | Admitting: Psychiatry

## 2021-01-08 MED FILL — traZODone HCL 50 MG TABS: 50 | 30 days supply | Qty: 30 | Fill #2

## 2021-01-08 MED FILL — busPIRone HCL 10 MG TABS: 10 | 30 days supply | Qty: 90 | Fill #2

## 2021-01-08 MED FILL — PRAZOSIN 1 MG CAPSULE: 1 | 30 days supply | Qty: 90 | Fill #1

## 2021-01-21 ENCOUNTER — Encounter (HOSPITAL_COMMUNITY): Payer: Self-pay | Admitting: Psychiatry

## 2021-01-21 ENCOUNTER — Other Ambulatory Visit: Payer: Self-pay

## 2021-01-21 ENCOUNTER — Telehealth (INDEPENDENT_AMBULATORY_CARE_PROVIDER_SITE_OTHER): Payer: No Payment, Other | Admitting: Psychiatry

## 2021-01-21 ENCOUNTER — Other Ambulatory Visit (HOSPITAL_COMMUNITY): Payer: Self-pay | Admitting: Psychiatry

## 2021-01-21 DIAGNOSIS — F3130 Bipolar disorder, current episode depressed, mild or moderate severity, unspecified: Secondary | ICD-10-CM

## 2021-01-21 DIAGNOSIS — F4010 Social phobia, unspecified: Secondary | ICD-10-CM | POA: Diagnosis not present

## 2021-01-21 DIAGNOSIS — F4310 Post-traumatic stress disorder, unspecified: Secondary | ICD-10-CM

## 2021-01-21 DIAGNOSIS — F900 Attention-deficit hyperactivity disorder, predominantly inattentive type: Secondary | ICD-10-CM | POA: Diagnosis not present

## 2021-01-21 DIAGNOSIS — G2401 Drug induced subacute dyskinesia: Secondary | ICD-10-CM | POA: Diagnosis not present

## 2021-01-21 MED ORDER — ARIPIPRAZOLE 5 MG PO TABS: 5.0000 mg | ORAL_TABLET | Freq: Every day | ORAL | 2 refills | Status: DC

## 2021-01-21 MED ORDER — TRAZODONE HCL 100 MG PO TABS: 100.0000 mg | ORAL_TABLET | Freq: Every day | ORAL | 2 refills | Status: DC

## 2021-01-21 MED ORDER — BUSPIRONE HCL 10 MG PO TABS: 10.0000 mg | ORAL_TABLET | Freq: Three times a day (TID) | ORAL | 2 refills | Status: DC

## 2021-01-21 MED ORDER — AUSTEDO 12 MG PO TABS: 18.0000 mg | ORAL_TABLET | Freq: Two times a day (BID) | ORAL | 30 refills | Status: DC

## 2021-01-21 MED ORDER — PRAZOSIN HCL 1 MG PO CAPS: 3.0000 mg | ORAL_CAPSULE | Freq: Every day | ORAL | 2 refills | Status: DC

## 2021-01-21 MED ORDER — ATOMOXETINE HCL 40 MG PO CAPS: 40.0000 mg | ORAL_CAPSULE | Freq: Every day | ORAL | 2 refills | Status: DC

## 2021-01-21 MED FILL — ARIPiprazole 5 MG TABS: 5 | 30 days supply | Qty: 30 | Fill #0

## 2021-01-21 NOTE — Progress Notes (Signed)
BH MD/PA/NP OP Progress Note Virtual Visit via Telephone Note  I connected with Fernando Garrison on 01/21/21 at  4:30 PM EST by telephone and verified that I am speaking with the correct person using two identifiers.  Location: Patient: home Provider: Clinic   I discussed the limitations, risks, security and privacy concerns of performing an evaluation and management service by telephone and the availability of in person appointments. I also discussed with the patient that there may be a patient responsible charge related to this service. The patient expressed understanding and agreed to proceed.   I provided 30 minutes of non-face-to-face time during this encounter.   01/21/2021 10:01 AM Fernando Garrison  MRN:  [id number]  Chief Complaint:" The Austedo works great"    HPI: [name] is a 55 year old male seen today for follow-up psychiatric evaluation. Patient has a history of OCD, ADD, bipolar 1, tardive dyskinesia, antisocial personality disorder, and PTSD and is currently managed on Straterra 40mg  daily, Saphris 10mg  at bedtime, Austedo 18mg  twice daily, Trintellix 20mg  daily, Trazodone 50mg  at night as needed, and Prazosin 3mg  at bedtime.  Patient notes that he discontinued Saphris and Trintelleix because it made him restless and he notes it was ineffective.   Today patient is pleasant, calm, and cooperative.  Since his last visit he discontinued Saphris and Trintellix because they were ineffective.  He notes that his depression and anxiety seems the same since discontinuing those medications.  He also informed provider that he does not see a difference in his mood and denies symptoms of mania.  Today provider conducted a GAD-7 and patient scored a 19.  Provider also conducted a PHQ-9 and patient scored a 14.  He denies SI/HI/VH or paranoia.  Patient does endorse auditory hallucinations noting that he hears lawnmowers and a radio (that plays the same song over and over again).  Patient notes that his  sleep continues to be poor.  He informed provider that he sleeps 3 to 4 hours a night.  He endorses having a good appetite.  Patient informed provider that since starting the start of his TD has improved.  He notes that he discontinued Cogentin.   Patient is agreeable to starting Abilify 5 mg to help manage AH and depression.  He will increase trazodone 50 mg to 100 mg to manage sleep. Potential side effects of medication and risks vs benefits of treatment vs non-treatment were explained and discussed. All questions were answered. He will continue all other medications as prescribed.  No other concerns noted at this time. Visit Diagnosis:     ICD-10-CM   1. Bipolar I disorder, most recent episode depressed (HCC)  F31.30 traZODone (DESYREL) 100 MG tablet    ARIPiprazole (ABILIFY) 5 MG tablet  2. Attention deficit hyperactivity disorder (ADHD), predominantly inattentive type  F90.0 atomoxetine (STRATTERA) 40 MG capsule  3. Social anxiety disorder  F40.10 busPIRone (BUSPAR) 10 MG tablet    traZODone (DESYREL) 100 MG tablet  4. Tardive dyskinesia  G24.01 Deutetrabenazine (AUSTEDO) 12 MG TABS  5. PTSD (post-traumatic stress disorder)  F43.10 prazosin (MINIPRESS) 1 MG capsule    traZODone (DESYREL) 100 MG tablet    Past Psychiatric History: Patient reports he has a history of ADD, bipolar disorder, PTSD, OCD, and antisocial personality disorder   Past Medical History:  Past Medical History:  Diagnosis Date  . PUD (peptic ulcer disease)     Past Surgical History:  Procedure Laterality Date  . CERVICAL DISCECTOMY    . MIDDLE EAR  SURGERY      Family Psychiatric History: Mother adopted unknown mental issues. Niece ADHD, nephew autism, nephew schizophrenia and ADHD,  Family History: History reviewed. No pertinent family history.  Social History:  Social History   Socioeconomic History  . Marital status: Single    Spouse name: Not on file  . Number of children: Not on file  . Years of  education: Not on file  . Highest education level: Not on file  Occupational History  . Not on file  Tobacco Use  . Smoking status: Current Every Day Smoker    Types: Cigarettes  . Smokeless tobacco: Never Used  Substance and Sexual Activity  . Alcohol use: Yes  . Drug use: Yes    Types: Marijuana  . Sexual activity: Not on file  Other Topics Concern  . Not on file  Social History Narrative  . Not on file   Social Determinants of Health   Financial Resource Strain: Not on file  Food Insecurity: Not on file  Transportation Needs: Not on file  Physical Activity: Not on file  Stress: Not on file  Social Connections: Not on file    Allergies: No Known Allergies  Metabolic Disorder Labs: No results found for: HGBA1C, MPG No results found for: PROLACTIN No results found for: CHOL, TRIG, HDL, CHOLHDL, VLDL, LDLCALC No results found for: TSH  Therapeutic Level Labs: No results found for: LITHIUM No results found for: VALPROATE No components found for:  CBMZ  Current Medications: Current Outpatient Medications  Medication Sig Dispense Refill  . ARIPiprazole (ABILIFY) 5 MG tablet Take 1 tablet (5 mg total) by mouth daily. 30 tablet 2  . atomoxetine (STRATTERA) 40 MG capsule Take 1 capsule (40 mg total) by mouth daily. 30 capsule 2  . busPIRone (BUSPAR) 10 MG tablet Take 1 tablet (10 mg total) by mouth 3 (three) times daily. 90 tablet 2  . Deutetrabenazine (AUSTEDO) 12 MG TABS Take 18 mg by mouth in the morning and at bedtime. 60 tablet 30  . nitroGLYCERIN (NITROSTAT) 0.4 MG SL tablet Place 0.4 mg under the tongue every 5 (five) minutes as needed for chest pain.    Marland Kitchen omeprazole (PRILOSEC) 40 MG capsule Take 1 capsule (40 mg total) by mouth daily. 30 capsule 0  . prazosin (MINIPRESS) 1 MG capsule Take 3 capsules (3 mg total) by mouth at bedtime. 90 capsule 2  . sucralfate (CARAFATE) 1 g tablet Take 1 tablet (1 g total) by mouth 4 (four) times daily -  with meals and at bedtime.  40 tablet 0  . traZODone (DESYREL) 100 MG tablet Take 1 tablet (100 mg total) by mouth at bedtime. 30 tablet 2   No current facility-administered medications for this visit.     Musculoskeletal: Strength & Muscle Tone: Unable to assess due to telephone visit Gait & Station: Unable to assess due to telephone visit Patient leans: N/A  Psychiatric Specialty Exam: Review of Systems  There were no vitals taken for this visit.There is no height or weight on file to calculate BMI.  General Appearance: Unable to assess due to telephone visit  Eye Contact:  Unable to assess due to telephone visit  Speech:  Clear and Coherent and Normal Rate  Volume:  Normal  Mood:  Euthymic  Affect:  Appropriate and Congruent  Thought Process:  Coherent, Goal Directed and Linear  Orientation:  Full (Time, Place, and Person)  Thought Content: WDL and Logical   Suicidal Thoughts:  No  Homicidal Thoughts:  No  Memory:  Immediate;   Good Recent;   Good Remote;   Good  Judgement:  Good  Insight:  Good  Psychomotor Activity:  Normal  Concentration:  Concentration: Good and Attention Span: Good  Recall:  Good  Fund of Knowledge: Good  Language: Good  Akathisia:  No  Handed:  Right  AIMS (if indicated): Notes  Assets:  Communication Skills Desire for Improvement Financial Resources/Insurance Housing Social Support  ADL's:  Intact  Cognition: WNL  Sleep:  Poor   Screenings: GAD-7   Flowsheet Row Video Visit [facility]/21/2021 in [facility]  Total GAD-7 Score 19    PHQ2-9   Flowsheet Row Video Visit from 01/21/2021 in [facility]  PHQ-2 Total Score 3  PHQ-9 Total Score 14       Assessment and Plan: Patient endorses symptoms of depression, anxiety, AH, and insomnia. He is agreeable to starting Abilify 5 mg to help manage AH and depression.  He will increase trazodone 50 mg to 100 mg to manage sleep. He will continue all other medications as  prescribed.    1. Attention deficit hyperactivity disorder (ADHD), predominantly inattentive type  Continue- atomoxetine (STRATTERA) 40 MG capsule; Take 1 capsule (40 mg total) by mouth daily.  Dispense: 30 capsule; Refill: 2  2. Social anxiety disorder  Continue- busPIRone (BUSPAR) 10 MG tablet; Take 1 tablet (10 mg total) by mouth 3 (three) times daily.  Dispense: 90 tablet; Refill: 2 Increased- traZODone (DESYREL) 100 MG tablet; Take 1 tablet (100 mg total) by mouth at bedtime.  Dispense: 30 tablet; Refill: 2  3. Tardive dyskinesia  Continue- Deutetrabenazine (AUSTEDO) 12 MG TABS; Take 18 mg by mouth in the morning and at bedtime.  Dispense: 60 tablet; Refill: 30  4. PTSD (post-traumatic stress disorder)  Continue- prazosin (MINIPRESS) 1 MG capsule; Take 3 capsules (3 mg total) by mouth at bedtime.  Dispense: 90 capsule; Refill: 2 Increased- traZODone (DESYREL) 100 MG tablet; Take 1 tablet (100 mg total) by mouth at bedtime.  Dispense: 30 tablet; Refill: 2  5. Bipolar I disorder, most recent episode depressed (HCC)  Increased- traZODone (DESYREL) 100 MG tablet; Take 1 tablet (100 mg total) by mouth at bedtime.  Dispense: 30 tablet; Refill: 2 Start- ARIPiprazole (ABILIFY) 5 MG tablet; Take 1 tablet (5 mg total) by mouth daily.  Dispense: 30 tablet; Refill: 2    Follow-up in 3 months  Shanna Cisco, NP 01/21/2021, 10:01 AM

## 2021-01-29 ENCOUNTER — Telehealth: Payer: Self-pay

## 2021-01-29 NOTE — Telephone Encounter (Signed)
Fernando Garrison is re-enrolled in the patient assistance program for Austedo until 12/18/21.  [employer] will contact patient at the end of the year to begin the re-enrollment process for 2023.

## 2021-01-29 NOTE — Telephone Encounter (Signed)
Thank you. If medications need to be refilled please have patient call foe refills and provider will send it to her prefer pharmacy.

## 2021-02-08 MED FILL — TRAZODONE HCL 100 MG TABS: 100 | 30 days supply | Qty: 30 | Fill #0

## 2021-02-08 MED FILL — PRAZOSIN 1 MG CAPSULE: 1 | 30 days supply | Qty: 90 | Fill #2

## 2021-02-16 ENCOUNTER — Telehealth: Payer: Self-pay

## 2021-02-16 ENCOUNTER — Telehealth (HOSPITAL_COMMUNITY): Payer: Self-pay | Admitting: *Deleted

## 2021-02-16 ENCOUNTER — Other Ambulatory Visit (HOSPITAL_COMMUNITY): Payer: Self-pay | Admitting: Psychiatry

## 2021-02-16 DIAGNOSIS — F3130 Bipolar disorder, current episode depressed, mild or moderate severity, unspecified: Secondary | ICD-10-CM

## 2021-02-16 DIAGNOSIS — F900 Attention-deficit hyperactivity disorder, predominantly inattentive type: Secondary | ICD-10-CM

## 2021-02-16 MED ORDER — ATOMOXETINE HCL 80 MG PO CAPS: 80.0000 mg | ORAL_CAPSULE | Freq: Every day | ORAL | 2 refills | Status: DC

## 2021-02-16 MED ORDER — QUETIAPINE FUMARATE 100 MG PO TABS: 100.0000 mg | ORAL_TABLET | Freq: Every day | ORAL | 2 refills | Status: DC

## 2021-02-16 MED FILL — QUETIAPINE FUMARATE 100 MG: 100 | 30 days supply | Qty: 30 | Fill #0

## 2021-02-16 NOTE — Telephone Encounter (Signed)
Call from Fernando Garrison to inform Dr Doyne Keel he is unable and unwilling to tolerate the side affects from the Abilify. He states he feels its exaggerated his ADHD and his anxiety and he has increased his cigarette smoking. He states he would rather experience his auditory hallucinations, which are noises and someone saying hello, not command in nature or dangerous. He is OK with his next appt being in May. He will discuss with Dr Doyne Keel if she feels there is a medicine for his AH without the intolerable side affects, but for now is stable and just wanted Dr Doyne Keel know where things were. Will forward this message to her.

## 2021-02-16 NOTE — Telephone Encounter (Signed)
Medication refilled and sent to [facility] in [place], [place].

## 2021-02-16 NOTE — Telephone Encounter (Signed)
Patient's Strattera is filled thru the patient assistance program with [employer] and shipped to patient's home address.  If appropriate, please send an escript to [facility] in [place], [place].  The program will dispense up to a 16-month supply at a time based on the rx prescribed/written quantity.

## 2021-02-16 NOTE — Telephone Encounter (Signed)
Patient notes that he is having similar problem on Abilify that he did on Saphris.  He notes that his auditory hallucinations worsened and notes that he is feeling more anxious.  He notes that he constantly hears noises or people say hello.  He denies command hallucinations.  Patient also notes that he has problems concentrating.  Today he is agreeable to starting Seroquel 100 mg to help manage auditory hallucinations.  He is also agreeable to increasing Strattera 40 mg to 80 mg to manage ADHD.  Patient notes that he prefers to stay away from stimulants due to his past substance use.  No other concerns noted at this time.

## 2021-03-10 MED FILL — PRAZOSIN 1 MG CAPSULE: 1 | 30 days supply | Qty: 90 | Fill #0

## 2021-03-10 MED FILL — TRAZODONE HCL 100 MG TABS: 100 | 30 days supply | Qty: 30 | Fill #1

## 2021-03-10 MED FILL — busPIRone HCL 10 MG TABS: 10 | 30 days supply | Qty: 90 | Fill #0

## 2021-03-18 MED FILL — QUETIAPINE FUMARATE 100 MG: 100 | 30 days supply | Qty: 30 | Fill #1

## 2021-03-20 ENCOUNTER — Other Ambulatory Visit: Payer: Self-pay

## 2021-04-07 ENCOUNTER — Other Ambulatory Visit: Payer: Self-pay

## 2021-04-07 MED FILL — Prazosin HCl Cap 1 MG: ORAL | 30 days supply | Qty: 90 | Fill #0 | Status: AC

## 2021-04-07 MED FILL — Trazodone HCl Tab 100 MG: ORAL | 30 days supply | Qty: 30 | Fill #0 | Status: AC

## 2021-04-08 ENCOUNTER — Other Ambulatory Visit: Payer: Self-pay

## 2021-04-19 ENCOUNTER — Other Ambulatory Visit: Payer: Self-pay

## 2021-04-19 MED FILL — Quetiapine Fumarate Tab 100 MG: ORAL | 30 days supply | Qty: 30 | Fill #0 | Status: AC

## 2021-04-20 ENCOUNTER — Other Ambulatory Visit: Payer: Self-pay

## 2021-04-20 ENCOUNTER — Encounter (HOSPITAL_COMMUNITY): Payer: Self-pay | Admitting: Psychiatry

## 2021-04-20 ENCOUNTER — Telehealth (INDEPENDENT_AMBULATORY_CARE_PROVIDER_SITE_OTHER): Payer: No Payment, Other | Admitting: Psychiatry

## 2021-04-20 DIAGNOSIS — F900 Attention-deficit hyperactivity disorder, predominantly inattentive type: Secondary | ICD-10-CM

## 2021-04-20 DIAGNOSIS — F3130 Bipolar disorder, current episode depressed, mild or moderate severity, unspecified: Secondary | ICD-10-CM | POA: Diagnosis not present

## 2021-04-20 DIAGNOSIS — F4310 Post-traumatic stress disorder, unspecified: Secondary | ICD-10-CM | POA: Diagnosis not present

## 2021-04-20 DIAGNOSIS — F4010 Social phobia, unspecified: Secondary | ICD-10-CM

## 2021-04-20 DIAGNOSIS — G2401 Drug induced subacute dyskinesia: Secondary | ICD-10-CM

## 2021-04-20 MED ORDER — ATOMOXETINE HCL 80 MG PO CAPS
ORAL_CAPSULE | ORAL | 2 refills | Status: DC
  Filled 2021-04-20: qty 30, 30d supply, fill #0

## 2021-04-20 MED ORDER — BUSPIRONE HCL 10 MG PO TABS
ORAL_TABLET | Freq: Three times a day (TID) | ORAL | 2 refills | Status: DC
  Filled 2021-04-20: qty 90, 30d supply, fill #0

## 2021-04-20 MED ORDER — QUETIAPINE FUMARATE 100 MG PO TABS
ORAL_TABLET | Freq: Every day | ORAL | 2 refills | Status: DC
  Filled 2021-05-24: qty 30, 30d supply, fill #0

## 2021-04-20 MED ORDER — TRAZODONE HCL 100 MG PO TABS
ORAL_TABLET | Freq: Every day | ORAL | 2 refills | Status: DC
  Filled 2021-04-20: qty 30, fill #0
  Filled 2021-05-24: qty 30, 30d supply, fill #0

## 2021-04-20 MED ORDER — PRAZOSIN HCL 1 MG PO CAPS
ORAL_CAPSULE | ORAL | 2 refills | Status: DC
  Filled 2021-04-20: qty 90, fill #0
  Filled 2021-05-24: qty 90, 30d supply, fill #0
  Filled 2021-07-19: qty 90, 30d supply, fill #1

## 2021-04-20 MED ORDER — DEUTETRABENAZINE 12 MG PO TABS
ORAL_TABLET | ORAL | 30 refills | Status: DC
  Filled 2021-04-20: qty 60, fill #0

## 2021-04-20 NOTE — Progress Notes (Signed)
BH MD/PA/NP OP Progress Note Virtual Visit via Video Note  I connected with Fernando Garrison on 04/20/21 at 10:30 AM EDT by a video enabled telemedicine application and verified that I am speaking with the correct person using two identifiers.  Location: Patient: Home Provider: Clinic   I discussed the limitations of evaluation and management by telemedicine and the availability of in person appointments. The patient expressed understanding and agreed to proceed.  I provided 30 minutes of non-face-to-face time during this encounter.     04/20/2021 10:26 AM Fernando Garrison  MRN:  [id number]  Chief Complaint:" Im sleeping better"    HPI: Mr.Bellantoni is a 55 year old male seen today for follow-up psychiatric evaluation. Patient has a history of OCD, ADD, bipolar 1, tardive dyskinesia, antisocial personality disorder, and PTSD and is currently managed on Straterra 80mg  daily, Seroquel 100 mg at bedtime, Austedo 18mg  twice daily,Trazodone 50mg  at night as needed, and Prazosin 3mg  at bedtime.  Patient notes that his medications are effective in managing his psychiatric conditions.   Today patient is pleasant, calm, cooperative, engaged in conversation, and maintains eye count.  He informed that since his last visit he has been sleeping better.  He also notes that his anxiety and depression has improved.  Provider conducted a GAD-7 and patient scored a 0, at his last visit he scored a 19.  Provider also conducted a PHQ-9 and patient scored a 5, at his last visit he scored a 14.  Patient notes that since being on Seroquel he has not had hallucinations.  Today he denies SI/HI/VAH or paranoia.  Patient notes that he feels that he is oversleeping.  He notes that he is used to sleeping 3 to 4 hours however now he is sleeping 8 hours nightly.  Provider informed patient that 7  to 8 hours nightly is recommended.  Patient also notes that his appetite has improved.  No medication changes made today.  He will  continue all  medications as prescribed.  No other concerns noted at this time. Visit Diagnosis:     ICD-10-CM   1. Attention deficit hyperactivity disorder (ADHD), predominantly inattentive type  F90.0 atomoxetine (STRATTERA) 80 MG capsule  2. Social anxiety disorder  F40.10 busPIRone (BUSPAR) 10 MG tablet    traZODone (DESYREL) 100 MG tablet  3. PTSD (post-traumatic stress disorder)  F43.10 prazosin (MINIPRESS) 1 MG capsule    traZODone (DESYREL) 100 MG tablet  4. Bipolar I disorder, most recent episode depressed (HCC)  F31.30 QUEtiapine (SEROQUEL) 100 MG tablet    traZODone (DESYREL) 100 MG tablet  5. Tardive dyskinesia  G24.01 Deutetrabenazine 12 MG TABS    Past Psychiatric History: Patient reports he has a history of ADD, bipolar disorder, PTSD, OCD, and antisocial personality disorder   Past Medical History:  Past Medical History:  Diagnosis Date  . PUD (peptic ulcer disease)     Past Surgical History:  Procedure Laterality Date  . CERVICAL DISCECTOMY    . MIDDLE EAR SURGERY      Family Psychiatric History: Mother adopted unknown mental issues. Niece ADHD, nephew autism, nephew schizophrenia and ADHD,  Family History: History reviewed. No pertinent family history.  Social History:  Social History   Socioeconomic History  . Marital status: Single    Spouse name: Not on file  . Number of children: Not on file  . Years of education: Not on file  . Highest education level: Not on file  Occupational History  . Not on file  Tobacco Use  . Smoking status: Current Every Day Smoker    Types: Cigarettes  . Smokeless tobacco: Never Used  Substance and Sexual Activity  . Alcohol use: Yes  . Drug use: Yes    Types: Marijuana  . Sexual activity: Not on file  Other Topics Concern  . Not on file  Social History Narrative  . Not on file   Social Determinants of Health   Financial Resource Strain: Not on file  Food Insecurity: Not on file  Transportation Needs: Not on  file  Physical Activity: Not on file  Stress: Not on file  Social Connections: Not on file    Allergies: Not on File  Metabolic Disorder Labs: No results found for: HGBA1C, MPG No results found for: PROLACTIN No results found for: CHOL, TRIG, HDL, CHOLHDL, VLDL, LDLCALC No results found for: TSH  Therapeutic Level Labs: No results found for: LITHIUM No results found for: VALPROATE No components found for:  CBMZ  Current Medications: Current Outpatient Medications  Medication Sig Dispense Refill  . atomoxetine (STRATTERA) 80 MG capsule TAKE 1 CAPSULE (80 MG TOTAL) BY MOUTH DAILY. 30 capsule 2  . busPIRone (BUSPAR) 10 MG tablet TAKE 1 TABLET (10 MG TOTAL) BY MOUTH 3 (THREE) TIMES DAILY. 90 tablet 2  . Deutetrabenazine 12 MG TABS TAKE 18 MG BY MOUTH IN THE MORNING AND AT BEDTIME. 60 tablet 30  . nitroGLYCERIN (NITROSTAT) 0.4 MG SL tablet Place 0.4 mg under the tongue every 5 (five) minutes as needed for chest pain.    Marland Kitchen omeprazole (PRILOSEC) 40 MG capsule Take 1 capsule (40 mg total) by mouth daily. 30 capsule 0  . prazosin (MINIPRESS) 1 MG capsule TAKE 3 CAPSULES (3 MG TOTAL) BY MOUTH AT BEDTIME. 90 capsule 2  . QUEtiapine (SEROQUEL) 100 MG tablet TAKE 1 TABLET (100 MG TOTAL) BY MOUTH AT BEDTIME. 30 tablet 2  . sucralfate (CARAFATE) 1 g tablet Take 1 tablet (1 g total) by mouth 4 (four) times daily -  with meals and at bedtime. 40 tablet 0  . traZODone (DESYREL) 100 MG tablet TAKE 1 TABLET (100 MG TOTAL) BY MOUTH AT BEDTIME. 30 tablet 2   No current facility-administered medications for this visit.     Musculoskeletal: Strength & Muscle Tone: Unable to assess due to telehealth visit Gait & Station: Unable to assess due to telehealth visit Patient leans: N/A  Psychiatric Specialty Exam: Review of Systems  There were no vitals taken for this visit.There is no height or weight on file to calculate BMI.  General Appearance: Well Groomed  Eye Contact:  Good  Speech:  Clear and  Coherent and Normal Rate  Volume:  Normal  Mood:  Euthymic  Affect:  Appropriate and Congruent  Thought Process:  Coherent, Goal Directed and Linear  Orientation:  Full (Time, Place, and Person)  Thought Content: WDL and Logical   Suicidal Thoughts:  No  Homicidal Thoughts:  No  Memory:  Immediate;   Good Recent;   Good Remote;   Good  Judgement:  Good  Insight:  Good  Psychomotor Activity:  Normal  Concentration:  Concentration: Good and Attention Span: Good  Recall:  Good  Fund of Knowledge: Good  Language: Good  Akathisia:  No  Handed:  Right  AIMS (if indicated): Notes  Assets:  Communication Skills Desire for Improvement Financial Resources/Insurance Housing Social Support  ADL's:  Intact  Cognition: WNL  Sleep:  Good   Screenings: GAD-7   Flowsheet Row Video Visit  from 04/20/2021 in [facility] Video Visit from 01/21/2021 in [facility]  Total GAD-7 Score 0 19    PHQ2-9   Flowsheet Row Video Visit from 04/20/2021 in [facility] Video Visit from 01/21/2021 in [facility]  PHQ-2 Total Score 2 3  PHQ-9 Total Score 5 14       Assessment and Plan: Patient notes that he is doing well on his current medication regimen.  No medication changes made today.  Patient agreeable to continue medications as prescribed.  1. Attention deficit hyperactivity disorder (ADHD), predominantly inattentive type  Continue- atomoxetine (STRATTERA) 40 MG capsule; Take 1 capsule (40 mg total) by mouth daily.  Dispense: 30 capsule; Refill: 2  2. Social anxiety disorder  Continue- busPIRone (BUSPAR) 10 MG tablet; Take 1 tablet (10 mg total) by mouth 3 (three) times daily.  Dispense: 90 tablet; Refill: 2 Continue- traZODone (DESYREL) 100 MG tablet; Take 1 tablet (100 mg total) by mouth at bedtime.  Dispense: 30 tablet; Refill: 2  3. Tardive dyskinesia  Continue- Deutetrabenazine  (AUSTEDO) 12 MG TABS; Take 18 mg by mouth in the morning and at bedtime.  Dispense: 60 tablet; Refill: 30  4. PTSD (post-traumatic stress disorder)  Continue- prazosin (MINIPRESS) 1 MG capsule; Take 3 capsules (3 mg total) by mouth at bedtime.  Dispense: 90 capsule; Refill: 2 Increased- traZODone (DESYREL) 100 MG tablet; Take 1 tablet (100 mg total) by mouth at bedtime.  Dispense: 30 tablet; Refill: 2  5. Bipolar I disorder, most recent episode depressed (HCC)  Continue- traZODone (DESYREL) 100 MG tablet; Take 1 tablet (100 mg total) by mouth at bedtime.  Dispense: 30 tablet; Refill: 2 Continue- QUEtiapine (SEROQUEL) 100 MG tablet; TAKE 1 TABLET (100 MG TOTAL) BY MOUTH AT BEDTIME.  Dispense: 30 tablet; Refill: 2        Follow-up in 3 months  Shanna Cisco, NP 04/20/2021, 10:26 AM

## 2021-05-24 ENCOUNTER — Other Ambulatory Visit: Payer: Self-pay

## 2021-06-14 ENCOUNTER — Other Ambulatory Visit: Payer: Self-pay

## 2021-07-14 ENCOUNTER — Other Ambulatory Visit: Payer: Self-pay

## 2021-07-16 ENCOUNTER — Other Ambulatory Visit: Payer: Self-pay

## 2021-07-19 ENCOUNTER — Other Ambulatory Visit: Payer: Self-pay

## 2021-07-21 ENCOUNTER — Encounter (HOSPITAL_COMMUNITY): Payer: Self-pay | Admitting: Psychiatry

## 2021-07-21 ENCOUNTER — Telehealth (INDEPENDENT_AMBULATORY_CARE_PROVIDER_SITE_OTHER): Payer: No Payment, Other | Admitting: Psychiatry

## 2021-07-21 ENCOUNTER — Other Ambulatory Visit: Payer: Self-pay

## 2021-07-21 DIAGNOSIS — G2401 Drug induced subacute dyskinesia: Secondary | ICD-10-CM

## 2021-07-21 DIAGNOSIS — F4310 Post-traumatic stress disorder, unspecified: Secondary | ICD-10-CM

## 2021-07-21 DIAGNOSIS — F4010 Social phobia, unspecified: Secondary | ICD-10-CM

## 2021-07-21 DIAGNOSIS — F3130 Bipolar disorder, current episode depressed, mild or moderate severity, unspecified: Secondary | ICD-10-CM

## 2021-07-21 DIAGNOSIS — F900 Attention-deficit hyperactivity disorder, predominantly inattentive type: Secondary | ICD-10-CM

## 2021-07-21 MED ORDER — PRAZOSIN HCL 1 MG PO CAPS
ORAL_CAPSULE | ORAL | 2 refills | Status: DC
  Filled 2021-07-21: qty 90, fill #0
  Filled 2021-08-25: qty 90, 30d supply, fill #0
  Filled 2021-10-01: qty 90, 30d supply, fill #1

## 2021-07-21 MED ORDER — BUSPIRONE HCL 10 MG PO TABS
ORAL_TABLET | Freq: Three times a day (TID) | ORAL | 2 refills | Status: DC
  Filled 2021-07-21: qty 90, 30d supply, fill #0

## 2021-07-21 MED ORDER — TRAZODONE HCL 100 MG PO TABS
ORAL_TABLET | Freq: Every day | ORAL | 2 refills | Status: DC
Start: 2021-07-21 — End: 2021-10-21
  Filled 2021-07-21: qty 30, 30d supply, fill #0

## 2021-07-21 MED ORDER — QUETIAPINE FUMARATE 100 MG PO TABS
ORAL_TABLET | Freq: Every day | ORAL | 2 refills | Status: DC
Start: 2021-07-21 — End: 2021-10-21
  Filled 2021-07-21: qty 30, 30d supply, fill #0

## 2021-07-21 NOTE — Progress Notes (Signed)
BH MD/PA/NP OP Progress Note Virtual Visit via Video Note  I connected with Fernando Garrison on 07/21/21 at 11:00 AM EDT by a video enabled telemedicine application and verified that I am speaking with the correct person using two identifiers.  Location: Patient: Home Provider: Clinic   I discussed the limitations of evaluation and management by telemedicine and the availability of in person appointments. The patient expressed understanding and agreed to proceed.  I provided 30 minutes of non-face-to-face time during this encounter.     07/21/2021 11:19 AM Fernando Garrison  MRN:  [id number]  Chief Complaint:"Everything has been going really good"    HPI: Fernando Garrison is a 55 year old male seen today for follow-up psychiatric evaluation. Patient has a history of OCD, ADD, bipolar 1, tardive dyskinesia, antisocial personality disorder, substance use, and PTSD and is currently managed on Straterra 80mg  daily (receives via patient care assistance), Seroquel 100 mg at bedtime, Austedo 18mg  twice daily  (receives via patient care assistance),Trazodone 50mg  at night as needed, and Prazosin 3mg  at bedtime.  Patient notes that his medications are effective in managing his psychiatric conditions.   Today patient is pleasant, calm, cooperative, engaged in conversation, and maintains eye count.  He informed that he is visiting [facility]  with some of his friends.  He notes that everything has been going really well.  He informed that a few weeks ago he got off of his morning routine after visiting with his NA support group for annual conference and forgot to take his medications.  He notes that he became overwhelmed and emotional however notes that now he feels mentally stable.  He denies symptoms of anxiety and depression. Provider conducted a GAD-7 and patient scored a 0, at his last visit he scored a 0.  Provider also conducted a PHQ-9 and patient scored a 0, at his last visit he scored a 5.  He  denies SI/HI/VAH, mania, or paranoia.  There is adequate sleep and appetite.  No medication changes made today.  He will continue all  medications as prescribed. At this time he notes that he does not need a refill of Strattera for unstoppable as he receives these 2 medications via patient care assistance. No other concerns noted at this time. Visit Diagnosis:     ICD-10-CM   1. Attention deficit hyperactivity disorder (ADHD), predominantly inattentive type  F90.0     2. Social anxiety disorder  F40.10 busPIRone (BUSPAR) 10 MG tablet    traZODone (DESYREL) 100 MG tablet    3. Tardive dyskinesia  G24.01     4. PTSD (post-traumatic stress disorder)  F43.10 prazosin (MINIPRESS) 1 MG capsule    traZODone (DESYREL) 100 MG tablet    5. Bipolar I disorder, most recent episode depressed (HCC)  F31.30 QUEtiapine (SEROQUEL) 100 MG tablet    traZODone (DESYREL) 100 MG tablet      Past Psychiatric History: Patient reports he has a history of ADD, bipolar disorder, PTSD, OCD, substance use, and antisocial personality disorder   Past Medical History:  Past Medical History:  Diagnosis Date   PUD (peptic ulcer disease)     Past Surgical History:  Procedure Laterality Date   CERVICAL DISCECTOMY     MIDDLE EAR SURGERY      Family Psychiatric History: Mother adopted unknown mental issues. Niece ADHD, nephew autism, nephew schizophrenia and ADHD,  Family History: History reviewed. No pertinent family history.  Social History:  Social History   Socioeconomic History   Marital  status: Single    Spouse name: Not on file   Number of children: Not on file   Years of education: Not on file   Highest education level: Not on file  Occupational History   Not on file  Tobacco Use   Smoking status: Every Day    Types: Cigarettes   Smokeless tobacco: Never  Substance and Sexual Activity   Alcohol use: Yes   Drug use: Yes    Types: Marijuana   Sexual activity: Not on file  Other Topics  Concern   Not on file  Social History Narrative   Not on file   Social Determinants of Health   Financial Resource Strain: Not on file  Food Insecurity: Not on file  Transportation Needs: Not on file  Physical Activity: Not on file  Stress: Not on file  Social Connections: Not on file    Allergies: Not on File  Metabolic Disorder Labs: No results found for: HGBA1C, MPG No results found for: PROLACTIN No results found for: CHOL, TRIG, HDL, CHOLHDL, VLDL, LDLCALC No results found for: TSH  Therapeutic Level Labs: No results found for: LITHIUM No results found for: VALPROATE No components found for:  CBMZ  Current Medications: Current Outpatient Medications  Medication Sig Dispense Refill   atomoxetine (STRATTERA) 80 MG capsule TAKE 1 CAPSULE (80 MG TOTAL) BY MOUTH DAILY. 30 capsule 2   busPIRone (BUSPAR) 10 MG tablet TAKE 1 TABLET (10 MG TOTAL) BY MOUTH 3 (THREE) TIMES DAILY. 90 tablet 2   Deutetrabenazine 12 MG TABS TAKE 18 MG BY MOUTH IN THE MORNING AND AT BEDTIME. 60 tablet 30   nitroGLYCERIN (NITROSTAT) 0.4 MG SL tablet Place 0.4 mg under the tongue every 5 (five) minutes as needed for chest pain.     omeprazole (PRILOSEC) 40 MG capsule Take 1 capsule (40 mg total) by mouth daily. 30 capsule 0   prazosin (MINIPRESS) 1 MG capsule TAKE 3 CAPSULES (3 MG TOTAL) BY MOUTH AT BEDTIME. 90 capsule 2   QUEtiapine (SEROQUEL) 100 MG tablet TAKE 1 TABLET (100 MG TOTAL) BY MOUTH AT BEDTIME. 30 tablet 2   sucralfate (CARAFATE) 1 g tablet Take 1 tablet (1 g total) by mouth 4 (four) times daily -  with meals and at bedtime. 40 tablet 0   traZODone (DESYREL) 100 MG tablet TAKE 1 TABLET (100 MG TOTAL) BY MOUTH AT BEDTIME. 30 tablet 2   No current facility-administered medications for this visit.     Musculoskeletal: Strength & Muscle Tone:  Unable to assess due to telehealth visit Gait & Station:  Unable to assess due to telehealth visit Patient leans: N/A  Psychiatric Specialty  Exam: Review of Systems  There were no vitals taken for this visit.There is no height or weight on file to calculate BMI.  General Appearance: Well Groomed  Eye Contact:  Good  Speech:  Clear and Coherent and Normal Rate  Volume:  Normal  Mood:  Euthymic  Affect:  Appropriate and Congruent  Thought Process:  Coherent, Goal Directed and Linear  Orientation:  Full (Time, Place, and Person)  Thought Content: WDL and Logical   Suicidal Thoughts:  No  Homicidal Thoughts:  No  Memory:  Immediate;   Good Recent;   Good Remote;   Good  Judgement:  Good  Insight:  Good  Psychomotor Activity:  Normal  Concentration:  Concentration: Good and Attention Span: Good  Recall:  Good  Fund of Knowledge: Good  Language: Good  Akathisia:  No  Handed:  Right  AIMS (if indicated): Notes  Assets:  Communication Skills Desire for Improvement Financial Resources/Insurance Housing Social Support  ADL's:  Intact  Cognition: WNL  Sleep:  Good   Screenings: GAD-7    Flowsheet Row Video Visit from 07/21/2021 in [facility] Video Visit from 04/20/2021 in [facility] Video Visit from 01/21/2021 in [facility]  Total GAD-7 Score 0 0 19      PHQ2-9    Flowsheet Row Video Visit from 07/21/2021 in [facility] Video Visit from 04/20/2021 in [facility] Video Visit from 01/21/2021 in [facility]  PHQ-2 Total Score 0 2 3  PHQ-9 Total Score 0 5 14        Assessment and Plan: Patient notes that he is doing well on his current medication regimen.  No medication changes made today.  Patient agreeable to continue medications as prescribed.  At this time he notes that he does not need a refill of Strattera for unstoppable as he receives these 2 medications via patient care assistance.  1. Attention deficit hyperactivity disorder (ADHD),  predominantly inattentive type    2. Social anxiety disorder  Continue- busPIRone (BUSPAR) 10 MG tablet; Take 1 tablet (10 mg total) by mouth 3 (three) times daily.  Dispense: 90 tablet; Refill: 2 Continue- traZODone (DESYREL) 100 MG tablet; Take 1 tablet (100 mg total) by mouth at bedtime.  Dispense: 30 tablet; Refill: 2  3. Tardive dyskinesia    4. PTSD (post-traumatic stress disorder)  Continue- prazosin (MINIPRESS) 1 MG capsule; Take 3 capsules (3 mg total) by mouth at bedtime.  Dispense: 90 capsule; Refill: 2 Increased- traZODone (DESYREL) 100 MG tablet; Take 1 tablet (100 mg total) by mouth at bedtime.  Dispense: 30 tablet; Refill: 2  5. Bipolar I disorder, most recent episode depressed (HCC)  Continue- traZODone (DESYREL) 100 MG tablet; Take 1 tablet (100 mg total) by mouth at bedtime.  Dispense: 30 tablet; Refill: 2 Continue- QUEtiapine (SEROQUEL) 100 MG tablet; TAKE 1 TABLET (100 MG TOTAL) BY MOUTH AT BEDTIME.  Dispense: 30 tablet; Refill: 2        Follow-up in 3 months  Shanna Cisco, NP 07/21/2021, 11:19 AM

## 2021-07-26 ENCOUNTER — Other Ambulatory Visit: Payer: Self-pay

## 2021-07-28 ENCOUNTER — Other Ambulatory Visit: Payer: Self-pay

## 2021-08-25 ENCOUNTER — Other Ambulatory Visit: Payer: Self-pay

## 2021-10-01 ENCOUNTER — Other Ambulatory Visit: Payer: Self-pay

## 2021-10-07 ENCOUNTER — Other Ambulatory Visit: Payer: Self-pay

## 2021-10-21 ENCOUNTER — Telehealth (INDEPENDENT_AMBULATORY_CARE_PROVIDER_SITE_OTHER): Payer: No Payment, Other | Admitting: Psychiatry

## 2021-10-21 ENCOUNTER — Encounter (HOSPITAL_COMMUNITY): Payer: Self-pay | Admitting: Psychiatry

## 2021-10-21 DIAGNOSIS — F4310 Post-traumatic stress disorder, unspecified: Secondary | ICD-10-CM | POA: Diagnosis not present

## 2021-10-21 DIAGNOSIS — F900 Attention-deficit hyperactivity disorder, predominantly inattentive type: Secondary | ICD-10-CM

## 2021-10-21 DIAGNOSIS — G2401 Drug induced subacute dyskinesia: Secondary | ICD-10-CM | POA: Diagnosis not present

## 2021-10-21 DIAGNOSIS — F4010 Social phobia, unspecified: Secondary | ICD-10-CM | POA: Diagnosis not present

## 2021-10-21 DIAGNOSIS — F3130 Bipolar disorder, current episode depressed, mild or moderate severity, unspecified: Secondary | ICD-10-CM

## 2021-10-21 MED ORDER — PRAZOSIN HCL 1 MG PO CAPS
ORAL_CAPSULE | ORAL | 3 refills | Status: DC
  Filled 2021-11-03: qty 90, 30d supply, fill #0
  Filled 2021-12-14: qty 90, 30d supply, fill #1

## 2021-10-21 MED ORDER — TRAZODONE HCL 100 MG PO TABS
ORAL_TABLET | Freq: Every day | ORAL | 3 refills | Status: DC
Start: 2021-10-21 — End: 2022-01-17
  Filled 2021-11-03: qty 30, 30d supply, fill #0

## 2021-10-21 MED ORDER — ATOMOXETINE HCL 80 MG PO CAPS: ORAL_CAPSULE | ORAL | 3 refills | Status: DC

## 2021-10-21 MED ORDER — QUETIAPINE FUMARATE 100 MG PO TABS
ORAL_TABLET | Freq: Every day | ORAL | 3 refills | Status: DC
  Filled 2021-11-03: qty 30, 30d supply, fill #0

## 2021-10-21 MED ORDER — BUSPIRONE HCL 10 MG PO TABS
ORAL_TABLET | Freq: Three times a day (TID) | ORAL | 3 refills | Status: DC
  Filled 2021-11-03: qty 90, 30d supply, fill #0

## 2021-10-21 MED ORDER — DEUTETRABENAZINE 12 MG PO TABS: ORAL_TABLET | ORAL | 3 refills | Status: DC

## 2021-10-21 NOTE — Progress Notes (Signed)
BH MD/PA/NP OP Progress Note Virtual Visit via Telephone Note  I connected with Fernando Garrison on 10/21/21 at 11:30 AM EDT by telephone and verified that I am speaking with the correct person using two identifiers.  Location: Patient: home Provider: Clinic   I discussed the limitations, risks, security and privacy concerns of performing an evaluation and management service by telephone and the availability of in person appointments. I also discussed with the patient that there may be a patient responsible charge related to this service. The patient expressed understanding and agreed to proceed.   I provided 30 minutes of non-face-to-face time during this encounter.     10/21/2021 11:49 AM Fernando Garrison  MRN:  [id number]  Chief Complaint:"I have been well"    HPI: [name] is a 55 year old male seen today for follow-up psychiatric evaluation. Patient has a history of OCD, ADD, bipolar 1, tardive dyskinesia, antisocial personality disorder, substance use, and PTSD and is currently managed on Straterra 80mg  daily (receives via patient care assistance), Seroquel 100 mg at bedtime, Austedo 18mg  twice daily  (receives via patient care assistance),Trazodone 50mg  at night as needed, and Prazosin 3mg  at bedtime.  Patient notes that his medications are effective in managing his psychiatric conditions.   Today patient was unable to logon virtually so his assessment done over the phone.  During exam he was pleasant, calm, cooperative, and engaged in conversation.  He informed that he is traveling to [place].  He informed that since his last visit things have been going well.  He notes that his mood is stable and denies symptoms of anxiety and depression.  Provider conducted a GAD-7 and PHQ-9 and patient scored a 0 on both, at his last visit he scored a 0.  Today he denies SI/HI/VAH, mania, or paranoia.  He endorsed adequate sleep and appetite.   Patient informed writer that at the end of the  month he has more nightmares however notes that he believes its due to increased stress.  No medication changes made today.  Patient agreeable to continue medications as prescribed.  No other concerns at this time.   Visit Diagnosis:     ICD-10-CM   1. Attention deficit hyperactivity disorder (ADHD), predominantly inattentive type  F90.0 atomoxetine (STRATTERA) 80 MG capsule    2. Social anxiety disorder  F40.10 busPIRone (BUSPAR) 10 MG tablet    traZODone (DESYREL) 100 MG tablet    3. Tardive dyskinesia  G24.01 Deutetrabenazine 12 MG TABS    4. PTSD (post-traumatic stress disorder)  F43.10 prazosin (MINIPRESS) 1 MG capsule    traZODone (DESYREL) 100 MG tablet    5. Bipolar I disorder, most recent episode depressed (HCC)  F31.30 QUEtiapine (SEROQUEL) 100 MG tablet    traZODone (DESYREL) 100 MG tablet      Past Psychiatric History: Patient reports he has a history of ADD, bipolar disorder, PTSD, OCD, substance use, and antisocial personality disorder   Past Medical History:  Past Medical History:  Diagnosis Date   PUD (peptic ulcer disease)     Past Surgical History:  Procedure Laterality Date   CERVICAL DISCECTOMY     MIDDLE EAR SURGERY      Family Psychiatric History: Mother adopted unknown mental issues. Niece ADHD, nephew autism, nephew schizophrenia and ADHD,  Family History: History reviewed. No pertinent family history.  Social History:  Social History   Socioeconomic History   Marital status: Single    Spouse name: Not on file   Number of children:  Not on file   Years of education: Not on file   Highest education level: Not on file  Occupational History   Not on file  Tobacco Use   Smoking status: Every Day    Types: Cigarettes   Smokeless tobacco: Never  Substance and Sexual Activity   Alcohol use: Yes   Drug use: Yes    Types: Marijuana   Sexual activity: Not on file  Other Topics Concern   Not on file  Social History Narrative   Not on file    Social Determinants of Health   Financial Resource Strain: Not on file  Food Insecurity: Not on file  Transportation Needs: Not on file  Physical Activity: Not on file  Stress: Not on file  Social Connections: Not on file    Allergies: Not on File  Metabolic Disorder Labs: No results found for: HGBA1C, MPG No results found for: PROLACTIN No results found for: CHOL, TRIG, HDL, CHOLHDL, VLDL, LDLCALC No results found for: TSH  Therapeutic Level Labs: No results found for: LITHIUM No results found for: VALPROATE No components found for:  CBMZ  Current Medications: Current Outpatient Medications  Medication Sig Dispense Refill   atomoxetine (STRATTERA) 80 MG capsule TAKE 1 CAPSULE (80 MG TOTAL) BY MOUTH DAILY. 30 capsule 3   busPIRone (BUSPAR) 10 MG tablet TAKE 1 TABLET (10 MG TOTAL) BY MOUTH 3 (THREE) TIMES DAILY. 90 tablet 3   Deutetrabenazine 12 MG TABS TAKE 18 MG BY MOUTH IN THE MORNING AND AT BEDTIME. 60 tablet 3   nitroGLYCERIN (NITROSTAT) 0.4 MG SL tablet Place 0.4 mg under the tongue every 5 (five) minutes as needed for chest pain.     omeprazole (PRILOSEC) 40 MG capsule Take 1 capsule (40 mg total) by mouth daily. 30 capsule 0   prazosin (MINIPRESS) 1 MG capsule TAKE 3 CAPSULES (3 MG TOTAL) BY MOUTH AT BEDTIME. 90 capsule 3   QUEtiapine (SEROQUEL) 100 MG tablet TAKE 1 TABLET (100 MG TOTAL) BY MOUTH AT BEDTIME. 30 tablet 3   sucralfate (CARAFATE) 1 g tablet Take 1 tablet (1 g total) by mouth 4 (four) times daily -  with meals and at bedtime. 40 tablet 0   traZODone (DESYREL) 100 MG tablet TAKE 1 TABLET (100 MG TOTAL) BY MOUTH AT BEDTIME. 30 tablet 3   No current facility-administered medications for this visit.     Musculoskeletal: Strength & Muscle Tone:  Unable to assess due to telephone visit Gait & Station:  Unable to assess due to telephone visit Patient leans: N/A  Psychiatric Specialty Exam: Review of Systems  There were no vitals taken for this  visit.There is no height or weight on file to calculate BMI.  General Appearance:  Unable to assess due to telephone visit  Eye Contact:   Unable to assess due to telephone visit  Speech:  Clear and Coherent and Normal Rate  Volume:  Normal  Mood:  Euthymic  Affect:  Appropriate and Congruent  Thought Process:  Coherent, Goal Directed and Linear  Orientation:  Full (Time, Place, and Person)  Thought Content: WDL and Logical   Suicidal Thoughts:  No  Homicidal Thoughts:  No  Memory:  Immediate;   Good Recent;   Good Remote;   Good  Judgement:  Good  Insight:  Good  Psychomotor Activity:   Unable to assess due to telephone visit  Concentration:  Concentration: Good and Attention Span: Good  Recall:  Good  Fund of Knowledge: Good  Language: Good  Akathisia:   Unable to assess due to telephone visit  Handed:  Right  AIMS (if indicated): Notes  Assets:  Communication Skills Desire for Improvement Financial Resources/Insurance Housing Social Support  ADL's:  Intact  Cognition: WNL  Sleep:  Good   Screenings: GAD-7    Flowsheet Row Video [facility] 10/21/2021 [facility] Video Visit from 07/21/2021 in [facility] Video Visit from 04/20/2021 in [facility] Video Visit from 01/21/2021 in [facility]  Total GAD-7 Score 0 0 0 19      PHQ2-9    Flowsheet Row Video Visit from 10/21/2021 in [facility] Video Visit from 07/21/2021 in [facility] Video Visit from 04/20/2021 in [facility] Video Visit from 01/21/2021 in [facility]  PHQ-2 Total Score 0 0 2 3  PHQ-9 Total Score 0 0 5 14        Assessment and Plan: Patient notes that he is doing well on his current medication regimen.  He foes Archivist that at the end of the month he has nightmares however notes  that it is due to increased stress.  No medication changes made today.  Patient agreeable to continue medications as prescribed.  A   1. Attention deficit hyperactivity disorder (ADHD), predominantly inattentive type  Continue- atomoxetine (STRATTERA) 80 MG capsule; TAKE 1 CAPSULE (80 MG TOTAL) BY MOUTH DAILY.  Dispense: 30 capsule; Refill: 3  2. Social anxiety disorder  Continue- busPIRone (BUSPAR) 10 MG tablet; TAKE 1 TABLET (10 MG TOTAL) BY MOUTH 3 (THREE) TIMES DAILY.  Dispense: 90 tablet; Refill: 3 Continue- traZODone (DESYREL) 100 MG tablet; TAKE 1 TABLET (100 MG TOTAL) BY MOUTH AT BEDTIME.  Dispense: 30 tablet; Refill: 3  3. Tardive dyskinesia  Continue- Deutetrabenazine 12 MG TABS; TAKE 18 MG BY MOUTH IN THE MORNING AND AT BEDTIME.  Dispense: 60 tablet; Refill: 3  4. PTSD (post-traumatic stress disorder)  Continue- prazosin (MINIPRESS) 1 MG capsule; TAKE 3 CAPSULES (3 MG TOTAL) BY MOUTH AT BEDTIME.  Dispense: 90 capsule; Refill: 3 Continue- traZODone (DESYREL) 100 MG tablet; TAKE 1 TABLET (100 MG TOTAL) BY MOUTH AT BEDTIME.  Dispense: 30 tablet; Refill: 3  5. Bipolar I disorder, most recent episode depressed (HCC)  Continue- QUEtiapine (SEROQUEL) 100 MG tablet; TAKE 1 TABLET (100 MG TOTAL) BY MOUTH AT BEDTIME.  Dispense: 30 tablet; Refill: 3 Continue- traZODone (DESYREL) 100 MG tablet; TAKE 1 TABLET (100 MG TOTAL) BY MOUTH AT BEDTIME.  Dispense: 30 tablet; Refill: 3   Follow-up in 3 months  Shanna Cisco, NP 10/21/2021, 11:49 AM

## 2021-11-03 ENCOUNTER — Other Ambulatory Visit: Payer: Self-pay

## 2021-12-14 ENCOUNTER — Other Ambulatory Visit: Payer: Self-pay

## 2021-12-15 ENCOUNTER — Other Ambulatory Visit: Payer: Self-pay

## 2021-12-21 ENCOUNTER — Other Ambulatory Visit (HOSPITAL_COMMUNITY): Payer: Self-pay | Admitting: Psychiatry

## 2021-12-21 ENCOUNTER — Other Ambulatory Visit: Payer: Self-pay

## 2021-12-21 DIAGNOSIS — F900 Attention-deficit hyperactivity disorder, predominantly inattentive type: Secondary | ICD-10-CM

## 2021-12-21 DIAGNOSIS — G2401 Drug induced subacute dyskinesia: Secondary | ICD-10-CM

## 2021-12-21 MED ORDER — ATOMOXETINE HCL 80 MG PO CAPS: ORAL_CAPSULE | ORAL | 3 refills | Status: DC

## 2021-12-21 MED ORDER — DEUTETRABENAZINE 12 MG PO TABS: ORAL_TABLET | ORAL | 3 refills | Status: DC

## 2021-12-24 ENCOUNTER — Other Ambulatory Visit (HOSPITAL_COMMUNITY): Payer: Self-pay | Admitting: Psychiatry

## 2021-12-24 ENCOUNTER — Telehealth (HOSPITAL_COMMUNITY): Payer: Self-pay | Admitting: *Deleted

## 2021-12-24 DIAGNOSIS — G2401 Drug induced subacute dyskinesia: Secondary | ICD-10-CM

## 2021-12-24 MED ORDER — AUSTEDO 9 MG PO TABS: 18.0000 mg | ORAL_TABLET | Freq: Two times a day (BID) | ORAL | 3 refills | Status: DC

## 2021-12-24 NOTE — Telephone Encounter (Signed)
Provider filled Austedo 18 mg (two 9 mg tablets) to be taken twice daily quantity 120. Provider notified patient and called pharmacy to verify.

## 2021-12-24 NOTE — Telephone Encounter (Signed)
Call from patient, irritated because the dose of his Shauna Hugh is wrong and he states this is the third time something with this rx has been wrong. Currently he states the error is it was last written for 12 mg and it is supposed to be 18 mg BID and its to be sent to patient assistance. Will forward this request to Dr Doyne Keel to consider.

## 2021-12-28 ENCOUNTER — Telehealth: Payer: Self-pay | Admitting: *Deleted

## 2021-12-28 NOTE — Telephone Encounter (Signed)
Copied from CRM [id number]. Topic: General - Other >> Dec 24, 2021  9:36 AM Fields, Hospital doctor R wrote: Reason for CRM: pt calling in to speak to [facility] to let her know that his medication is still wrong, he requesting a callback asap

## 2022-01-17 ENCOUNTER — Other Ambulatory Visit: Payer: Self-pay

## 2022-01-17 ENCOUNTER — Telehealth (INDEPENDENT_AMBULATORY_CARE_PROVIDER_SITE_OTHER): Payer: No Payment, Other | Admitting: Psychiatry

## 2022-01-17 ENCOUNTER — Encounter (HOSPITAL_COMMUNITY): Payer: Self-pay | Admitting: Psychiatry

## 2022-01-17 DIAGNOSIS — G2401 Drug induced subacute dyskinesia: Secondary | ICD-10-CM | POA: Diagnosis not present

## 2022-01-17 DIAGNOSIS — F4010 Social phobia, unspecified: Secondary | ICD-10-CM | POA: Diagnosis not present

## 2022-01-17 DIAGNOSIS — F4310 Post-traumatic stress disorder, unspecified: Secondary | ICD-10-CM | POA: Diagnosis not present

## 2022-01-17 DIAGNOSIS — F3130 Bipolar disorder, current episode depressed, mild or moderate severity, unspecified: Secondary | ICD-10-CM | POA: Diagnosis not present

## 2022-01-17 MED ORDER — QUETIAPINE FUMARATE 100 MG PO TABS
ORAL_TABLET | Freq: Every day | ORAL | 3 refills | Status: DC
  Filled 2022-01-17 – 2022-02-15 (×2): qty 30, 30d supply, fill #0

## 2022-01-17 MED ORDER — TRAZODONE HCL 100 MG PO TABS
ORAL_TABLET | Freq: Every day | ORAL | 3 refills | Status: DC
  Filled 2022-01-17: qty 30, 30d supply, fill #0

## 2022-01-17 MED ORDER — BUSPIRONE HCL 10 MG PO TABS
ORAL_TABLET | Freq: Three times a day (TID) | ORAL | 3 refills | Status: DC
  Filled 2022-01-17: qty 90, 30d supply, fill #0

## 2022-01-17 MED ORDER — AUSTEDO 9 MG PO TABS
18.0000 mg | ORAL_TABLET | Freq: Two times a day (BID) | ORAL | 3 refills | Status: DC
  Filled 2022-01-17: qty 120, 30d supply, fill #0

## 2022-01-17 MED ORDER — PRAZOSIN HCL 1 MG PO CAPS
ORAL_CAPSULE | ORAL | 3 refills | Status: DC
  Filled 2022-01-17: qty 90, 30d supply, fill #0
  Filled 2022-02-21: qty 90, 30d supply, fill #1
  Filled 2022-03-25: qty 90, 30d supply, fill #2
  Filled 2022-04-29: qty 90, 30d supply, fill #3

## 2022-01-17 NOTE — Progress Notes (Signed)
BH MD/PA/NP OP Progress Note Virtual Visit via Telephone Note  I connected with Fernando Garrison on 01/17/22 at  2:00 PM EST by telephone and verified that I am speaking with the correct person using two identifiers.  Location: Patient: home Provider: Clinic   I discussed the limitations, risks, security and privacy concerns of performing an evaluation and management service by telephone and the availability of in person appointments. I also discussed with the patient that there may be a patient responsible charge related to this service. The patient expressed understanding and agreed to proceed.   I provided 30 minutes of non-face-to-face time during this encounter.     01/17/2022 2:27 PM Fernando Garrison  MRN:  [id number]  Chief Complaint:"I doing good"    HPI: [name] is a 56 year old male seen today for follow-up psychiatric evaluation. Patient has a history of OCD, ADD, bipolar 1, tardive dyskinesia, antisocial personality disorder, substance use (sober 2-1/2 years), and PTSD and is currently managed on Straterra 80mg  daily (receives via patient care assistance), Seroquel 100 mg at bedtime, Austedo 18mg  twice daily  (receives via patient care assistance),Trazodone 50mg  at night as needed, and Prazosin 3mg  at bedtime.  Patient notes that his medications are effective in managing his psychiatric conditions.   Today patient was unable to logon virtually so his assessment done over the phone.  During exam he was pleasant, calm, cooperative, and engaged in conversation.  He informed that overall he is doing good. He notes that his mood is stable and notes that he he has minimal anxiety and depression.  Patient reports at times he is worried about unforseen rejection and potential relapse.  He however reports that he has the tools to cope with these stressors.  He notes that he has been sober from substances for over 2-1/2 years and attends NA meetings regularly.  Today provider conducted a  GAD-7 and patient scored a 1, at his last visit he scored a 0.  Provider also conducted PHQ-9 and patient scored a 0, at his last visit he scored a 0.  Today he denies SI/HI/VAH, mania, or paranoia.  He endorsed adequate sleep and appetite.   Patient informed writer that prazosin has been effective in managing his nightmares.  Patient informed writer that he has been having pain in his heels.  He notes that his PCP has referred him to a podiatrist to help manage this.  No medication changes made today.  Patient agreeable to continue medications as prescribed.  No other concerns at this time.   Visit Diagnosis:     ICD-10-CM   1. Social anxiety disorder  F40.10 busPIRone (BUSPAR) 10 MG tablet    traZODone (DESYREL) 100 MG tablet    2. Bipolar I disorder, most recent episode depressed (HCC)  F31.30 QUEtiapine (SEROQUEL) 100 MG tablet    traZODone (DESYREL) 100 MG tablet    3. PTSD (post-traumatic stress disorder)  F43.10 traZODone (DESYREL) 100 MG tablet    prazosin (MINIPRESS) 1 MG capsule      Past Psychiatric History: Patient reports he has a history of ADD, bipolar disorder, PTSD, OCD, substance use, and antisocial personality disorder   Past Medical History:  Past Medical History:  Diagnosis Date   PUD (peptic ulcer disease)     Past Surgical History:  Procedure Laterality Date   CERVICAL DISCECTOMY     MIDDLE EAR SURGERY      Family Psychiatric History: Mother adopted unknown mental issues. Niece ADHD, nephew autism, nephew schizophrenia  and ADHD,  Family History: History reviewed. No pertinent family history.  Social History:  Social History   Socioeconomic History   Marital status: Single    Spouse name: Not on file   Number of children: Not on file   Years of education: Not on file   Highest education level: Not on file  Occupational History   Not on file  Tobacco Use   Smoking status: Every Day    Types: Cigarettes   Smokeless tobacco: Never  Substance  and Sexual Activity   Alcohol use: Yes   Drug use: Yes    Types: Marijuana   Sexual activity: Not on file  Other Topics Concern   Not on file  Social History Narrative   Not on file   Social Determinants of Health   Financial Resource Strain: Not on file  Food Insecurity: Not on file  Transportation Needs: Not on file  Physical Activity: Not on file  Stress: Not on file  Social Connections: Not on file    Allergies: Not on File  Metabolic Disorder Labs: No results found for: HGBA1C, MPG No results found for: PROLACTIN No results found for: CHOL, TRIG, HDL, CHOLHDL, VLDL, LDLCALC No results found for: TSH  Therapeutic Level Labs: No results found for: LITHIUM No results found for: VALPROATE No components found for:  CBMZ  Current Medications: Current Outpatient Medications  Medication Sig Dispense Refill   atomoxetine (STRATTERA) 80 MG capsule TAKE 1 CAPSULE (80 MG TOTAL) BY MOUTH DAILY. 30 capsule 3   busPIRone (BUSPAR) 10 MG tablet TAKE 1 TABLET (10 MG TOTAL) BY MOUTH 3 (THREE) TIMES DAILY. 90 tablet 3   Deutetrabenazine (AUSTEDO) 9 MG TABS Take 18 mg by mouth 2 (two) times daily. 120 tablet 3   nitroGLYCERIN (NITROSTAT) 0.4 MG SL tablet Place 0.4 mg under the tongue every 5 (five) minutes as needed for chest pain.     omeprazole (PRILOSEC) 40 MG capsule Take 1 capsule (40 mg total) by mouth daily. 30 capsule 0   prazosin (MINIPRESS) 1 MG capsule TAKE 3 CAPSULES (3 MG TOTAL) BY MOUTH AT BEDTIME. 90 capsule 3   QUEtiapine (SEROQUEL) 100 MG tablet TAKE 1 TABLET (100 MG TOTAL) BY MOUTH AT BEDTIME. 30 tablet 3   sucralfate (CARAFATE) 1 g tablet Take 1 tablet (1 g total) by mouth 4 (four) times daily -  with meals and at bedtime. 40 tablet 0   traZODone (DESYREL) 100 MG tablet TAKE 1 TABLET (100 MG TOTAL) BY MOUTH AT BEDTIME. 30 tablet 3   No current facility-administered medications for this visit.     Musculoskeletal: Strength & Muscle Tone:  Unable to assess due to  telephone visit Gait & Station:  Unable to assess due to telephone visit Patient leans: N/A  Psychiatric Specialty Exam: Review of Systems  There were no vitals taken for this visit.There is no height or weight on file to calculate BMI.  General Appearance:  Unable to assess due to telephone visit  Eye Contact:   Unable to assess due to telephone visit  Speech:  Clear and Coherent and Normal Rate  Volume:  Normal  Mood:  Euthymic  Affect:  Appropriate and Congruent  Thought Process:  Coherent, Goal Directed and Linear  Orientation:  Full (Time, Place, and Person)  Thought Content: WDL and Logical   Suicidal Thoughts:  No  Homicidal Thoughts:  No  Memory:  Immediate;   Good Recent;   Good Remote;   Good  Judgement:  Good  Insight:  Good  Psychomotor Activity:   Unable to assess due to telephone visit  Concentration:  Concentration: Good and Attention Span: Good  Recall:  Good  Fund of Knowledge: Good  Language: Good  Akathisia:   Unable to assess due to telephone visit  Handed:  Right  AIMS (if indicated): Notes  Assets:  Communication Skills Desire for Improvement Financial Resources/Insurance Housing Social Support  ADL's:  Intact  Cognition: WNL  Sleep:  Good   Screenings: GAD-7    Flowsheet Row [facility]  Total GAD-7 Score 1 0 0 0 19      PHQ2-9    Flowsheet Row [facility]  PHQ-2 Total Score 0 0 0 2 3  PHQ-9 Total Score 0 0 0 5 14        Assessment and Plan: Patient notes that he is doing well on his current medication regimen.  At times he notes that he has inquired about rejection and potential relapse however notes that he has tools to cope with this.  He attends NA meetings regularly.  At this time no medication changes made today.  Patient agreeable to continue medications as prescribed.  1. Attention deficit hyperactivity disorder (ADHD), predominantly inattentive type  Continue- atomoxetine (STRATTERA) 80 MG capsule; TAKE 1 CAPSULE (80 MG TOTAL) BY MOUTH DAILY.  Dispense: 30 capsule; Refill: 3  2. Social anxiety disorder  Continue- busPIRone (BUSPAR) 10 MG tablet; TAKE 1 TABLET (10 MG TOTAL) BY MOUTH 3 (THREE) TIMES DAILY.  Dispense: 90 tablet; Refill: 3 Continue- traZODone (DESYREL) 100 MG tablet; TAKE 1 TABLET (100 MG TOTAL) BY MOUTH AT BEDTIME.  Dispense: 30 tablet; Refill: 3  3. Tardive dyskinesia   Continue-Deutetrabenazine (AUSTEDO) 9 MG TABS; Take 18 mg by mouth 2 (two) times daily.  Dispense: 120 tablet; Refill: 3  4. PTSD (post-traumatic stress disorder)  Continue- prazosin (MINIPRESS) 1 MG capsule; TAKE 3 CAPSULES (3 MG TOTAL) BY MOUTH AT BEDTIME.  Dispense: 90 capsule; Refill: 3 Continue- traZODone (DESYREL) 100 MG tablet; TAKE 1 TABLET (100 MG TOTAL) BY MOUTH AT BEDTIME.  Dispense: 30 tablet; Refill: 3  5. Bipolar I disorder, most recent episode depressed (HCC)  Continue- QUEtiapine (SEROQUEL) 100 MG tablet; TAKE 1 TABLET (100 MG TOTAL) BY MOUTH AT BEDTIME.  Dispense: 30 tablet; Refill: 3 Continue- traZODone (DESYREL) 100 MG tablet; TAKE 1 TABLET (100 MG TOTAL) BY MOUTH AT BEDTIME.  Dispense: 30 tablet; Refill: 3      Follow-up in 3 months  Shanna CiscoBrittney E Sari Cogan, NP 01/17/2022, 2:27 PM

## 2022-01-18 ENCOUNTER — Other Ambulatory Visit: Payer: Self-pay

## 2022-01-19 ENCOUNTER — Other Ambulatory Visit: Payer: Self-pay

## 2022-01-24 ENCOUNTER — Other Ambulatory Visit: Payer: Self-pay

## 2022-02-15 ENCOUNTER — Other Ambulatory Visit: Payer: Self-pay

## 2022-02-21 ENCOUNTER — Other Ambulatory Visit: Payer: Self-pay

## 2022-03-25 ENCOUNTER — Other Ambulatory Visit: Payer: Self-pay

## 2022-03-31 ENCOUNTER — Encounter (HOSPITAL_COMMUNITY): Payer: Self-pay

## 2022-03-31 ENCOUNTER — Telehealth (HOSPITAL_COMMUNITY): Payer: No Payment, Other | Admitting: Psychiatry

## 2022-04-05 ENCOUNTER — Other Ambulatory Visit: Payer: Self-pay

## 2022-04-05 ENCOUNTER — Telehealth (HOSPITAL_COMMUNITY): Payer: Self-pay | Admitting: *Deleted

## 2022-04-05 NOTE — Telephone Encounter (Signed)
Opened a second time in error. 

## 2022-04-07 ENCOUNTER — Other Ambulatory Visit: Payer: Self-pay

## 2022-04-22 ENCOUNTER — Other Ambulatory Visit: Payer: Self-pay

## 2022-04-27 ENCOUNTER — Other Ambulatory Visit: Payer: Self-pay

## 2022-04-29 ENCOUNTER — Other Ambulatory Visit: Payer: Self-pay

## 2022-05-02 ENCOUNTER — Other Ambulatory Visit: Payer: Self-pay

## 2022-05-10 ENCOUNTER — Other Ambulatory Visit: Payer: Self-pay

## 2022-05-10 ENCOUNTER — Encounter (HOSPITAL_COMMUNITY): Payer: Self-pay | Admitting: Psychiatry

## 2022-05-10 ENCOUNTER — Telehealth (INDEPENDENT_AMBULATORY_CARE_PROVIDER_SITE_OTHER): Payer: No Payment, Other | Admitting: Psychiatry

## 2022-05-10 DIAGNOSIS — F4310 Post-traumatic stress disorder, unspecified: Secondary | ICD-10-CM | POA: Diagnosis not present

## 2022-05-10 DIAGNOSIS — G2401 Drug induced subacute dyskinesia: Secondary | ICD-10-CM | POA: Diagnosis not present

## 2022-05-10 DIAGNOSIS — F900 Attention-deficit hyperactivity disorder, predominantly inattentive type: Secondary | ICD-10-CM | POA: Diagnosis not present

## 2022-05-10 MED ORDER — PRAZOSIN HCL 1 MG PO CAPS
ORAL_CAPSULE | ORAL | 3 refills | Status: DC
  Filled 2022-05-10: qty 90, fill #0
  Filled 2022-06-02: qty 90, 30d supply, fill #0
  Filled 2022-07-11: qty 90, 30d supply, fill #1

## 2022-05-10 MED ORDER — ATOMOXETINE HCL 80 MG PO CAPS
ORAL_CAPSULE | ORAL | 3 refills | Status: DC
  Filled 2022-05-10: qty 30, 30d supply, fill #0

## 2022-05-10 MED ORDER — AUSTEDO 9 MG PO TABS
18.0000 mg | ORAL_TABLET | Freq: Two times a day (BID) | ORAL | 11 refills | Status: DC
  Filled 2022-05-10: qty 120, fill #0

## 2022-05-10 NOTE — Progress Notes (Signed)
BH MD/PA/NP OP Progress Note Virtual Visit via Telephone Note  I connected with Fernando Garrison on 05/10/22 at  3:00 PM EDT by telephone and verified that I am speaking with the correct person using two identifiers.  Location: Patient: home Provider: Clinic   I discussed the limitations, risks, security and privacy concerns of performing an evaluation and management service by telephone and the availability of in person appointments. I also discussed with the patient that there may be a patient responsible charge related to this service. The patient expressed understanding and agreed to proceed.   I provided 30 minutes of non-face-to-face time during this encounter.     05/10/2022 3:13 PM Fernando Garrison  MRN:  [id number]  Chief Complaint:"I ran out of Austedo"    HPI: 56 year old male seen today for follow-up psychiatric evaluation. Patient has a history of OCD, ADD, bipolar 1, tardive dyskinesia, antisocial personality disorder, substance use (sober 2-1/2 years), and PTSD and is currently managed on Straterra 80mg  daily (receives via patient care assistance), Seroquel 100 mg at bedtime, Austedo 18mg  twice daily  (receives via patient care assistance),Trazodone 50mg  at night as needed, and Prazosin 3mg  at bedtime.  Patient notes that he has discontinued trazodone and Seroquel and reports that his other medications are effective in managing his psychiatric conditions.   Today patient logged in virtually however audio disconnected so the rest of his visit was done over the phone.    During exam he was pleasant, calm, cooperative, and engaged in conversation.  He informed that overall he is doing good, but notes that he is in need of Auatedo refills. He notes that his mood is stable and notes that he he has minimal anxiety and depression.  Today he denies SI/HI/VAH, mania, or paranoia.  He endorsed adequate sleep and appetite.  Patient reports that he has lost 30 pounds over the last year through  portion control.  He informed that he feels physically healthy.  At this time patient does not want to restart Seroquel or trazodone.  He will continue prazosin, Austedo, and care as prescribed.  No other concerns at this time.   Visit Diagnosis:     ICD-10-CM   1. Tardive dyskinesia  G24.01 Deutetrabenazine (AUSTEDO) 9 MG TABS    2. Attention deficit hyperactivity disorder (ADHD), predominantly inattentive type  F90.0 atomoxetine (STRATTERA) 80 MG capsule    3. PTSD (post-traumatic stress disorder)  F43.10 prazosin (MINIPRESS) 1 MG capsule      Past Psychiatric History: Patient reports he has a history of ADD, bipolar disorder, PTSD, OCD, substance use, and antisocial personality disorder   Past Medical History:  Past Medical History:  Diagnosis Date   PUD (peptic ulcer disease)     Past Surgical History:  Procedure Laterality Date   CERVICAL DISCECTOMY     MIDDLE EAR SURGERY      Family Psychiatric History: Mother adopted unknown mental issues. Niece ADHD, nephew autism, nephew schizophrenia and ADHD,  Family History: No family history on file.  Social History:  Social History   Socioeconomic History   Marital status: Single    Spouse name: Not on file   Number of children: Not on file   Years of education: Not on file   Highest education level: Not on file  Occupational History   Not on file  Tobacco Use   Smoking status: Every Day    Types: Cigarettes   Smokeless tobacco: Never  Substance and Sexual Activity   Alcohol  use: Yes   Drug use: Yes    Types: Marijuana   Sexual activity: Not on file  Other Topics Concern   Not on file  Social History Narrative   Not on file   Social Determinants of Health   Financial Resource Strain: Not on file  Food Insecurity: Not on file  Transportation Needs: Not on file  Physical Activity: Not on file  Stress: Not on file  Social Connections: Not on file    Allergies: Not on File  Metabolic Disorder  Labs: No results found for: HGBA1C, MPG No results found for: PROLACTIN No results found for: CHOL, TRIG, HDL, CHOLHDL, VLDL, LDLCALC No results found for: TSH  Therapeutic Level Labs: No results found for: LITHIUM No results found for: VALPROATE No components found for:  CBMZ  Current Medications: Current Outpatient Medications  Medication Sig Dispense Refill   atomoxetine (STRATTERA) 80 MG capsule TAKE 1 CAPSULE (80 MG TOTAL) BY MOUTH DAILY. 30 capsule 3   Deutetrabenazine (AUSTEDO) 9 MG TABS Take 18 mg by mouth 2 (two) times daily. 120 tablet 11   nitroGLYCERIN (NITROSTAT) 0.4 MG SL tablet Place 0.4 mg under the tongue every 5 (five) minutes as needed for chest pain.     omeprazole (PRILOSEC) 40 MG capsule Take 1 capsule (40 mg total) by mouth daily. 30 capsule 0   prazosin (MINIPRESS) 1 MG capsule TAKE 3 CAPSULES (3 MG TOTAL) BY MOUTH AT BEDTIME. 90 capsule 3   sucralfate (CARAFATE) 1 g tablet Take 1 tablet (1 g total) by mouth 4 (four) times daily -  with meals and at bedtime. 40 tablet 0   No current facility-administered medications for this visit.     Musculoskeletal: Strength & Muscle Tone:  Unable to assess due to telephone visit Gait & Station:  Unable to assess due to telephone visit Patient leans: N/A  Psychiatric Specialty Exam: Review of Systems  There were no vitals taken for this visit.There is no height or weight on file to calculate BMI.  General Appearance:  Unable to assess due to telephone visit  Eye Contact:   Unable to assess due to telephone visit  Speech:  Clear and Coherent and Normal Rate  Volume:  Normal  Mood:  Euthymic  Affect:  Appropriate and Congruent  Thought Process:  Coherent, Goal Directed and Linear  Orientation:  Full (Time, Place, and Person)  Thought Content: WDL and Logical   Suicidal Thoughts:  No  Homicidal Thoughts:  No  Memory:  Immediate;   Good Recent;   Good Remote;   Good  Judgement:  Good  Insight:  Good  Psychomotor  Activity:   Unable to assess due to telephone visit  Concentration:  Concentration: Good and Attention Span: Good  Recall:  Good  Fund of Knowledge: Good  Language: Good  Akathisia:   Unable to assess due to telephone visit  Handed:  Right  AIMS (if indicated): Notes  Assets:  Communication Skills Desire for Improvement Financial Resources/Insurance Housing Social Support  ADL's:  Intact  Cognition: WNL  Sleep:  Good   Screenings: GAD-7    Flowsheet Row [facility] Visit from 01/17/2022 in [facility] Video Visit from 10/21/2021 in [facility] Video Visit from 07/21/2021 in [facility] Video Visit from 04/20/2021 in [facility] Video Visit from 01/21/2021 in [facility]  Total GAD-7 Score 1 0 0 0 19  [id number]    Flowsheet Row [facility]  PHQ-2 Total Score 0 0 0 2 3  PHQ-9 Total Score 0 0 0 5 14        Assessment and Plan: Patient notes that he is doing well on his current medication regimen.  At this time patient does not want to restart Seroquel or trazodone.  He will continue prazosin, Austedo, and care as prescribed.  1. Tardive dyskinesia  Continue- Deutetrabenazine (AUSTEDO) 9 MG TABS; Take 18 mg by mouth 2 (two) times daily.  Dispense: 120 tablet; Refill: 11  2. Attention deficit hyperactivity disorder (ADHD), predominantly inattentive type  Continue- atomoxetine (STRATTERA) 80 MG capsule; TAKE 1 CAPSULE (80 MG TOTAL) BY MOUTH DAILY.  Dispense: 30 capsule; Refill: 3  3. PTSD (post-traumatic stress  disorder)  Continue- prazosin (MINIPRESS) 1 MG capsule; TAKE 3 CAPSULES (3 MG TOTAL) BY MOUTH AT BEDTIME.  Dispense: 90 capsule; Refill: 3     Follow-up in 3 months  Shanna CiscoBrittney E Brandn Mcgath, NP 05/10/2022, 3:13 PM

## 2022-05-17 ENCOUNTER — Other Ambulatory Visit: Payer: Self-pay

## 2022-06-02 ENCOUNTER — Other Ambulatory Visit: Payer: Self-pay

## 2022-06-06 ENCOUNTER — Other Ambulatory Visit: Payer: Self-pay

## 2022-06-07 ENCOUNTER — Other Ambulatory Visit: Payer: Self-pay

## 2022-07-11 ENCOUNTER — Other Ambulatory Visit: Payer: Self-pay

## 2022-07-13 ENCOUNTER — Other Ambulatory Visit: Payer: Self-pay

## 2022-08-10 ENCOUNTER — Encounter (HOSPITAL_COMMUNITY): Payer: Self-pay | Admitting: Psychiatry

## 2022-08-10 ENCOUNTER — Other Ambulatory Visit: Payer: Self-pay

## 2022-08-10 ENCOUNTER — Telehealth (INDEPENDENT_AMBULATORY_CARE_PROVIDER_SITE_OTHER): Payer: Self-pay | Admitting: Psychiatry

## 2022-08-10 DIAGNOSIS — F4310 Post-traumatic stress disorder, unspecified: Secondary | ICD-10-CM

## 2022-08-10 DIAGNOSIS — F900 Attention-deficit hyperactivity disorder, predominantly inattentive type: Secondary | ICD-10-CM

## 2022-08-10 DIAGNOSIS — G2401 Drug induced subacute dyskinesia: Secondary | ICD-10-CM | POA: Diagnosis not present

## 2022-08-10 MED ORDER — PRAZOSIN HCL 1 MG PO CAPS
ORAL_CAPSULE | ORAL | 3 refills | Status: DC
  Filled 2022-08-10: qty 90, 30d supply, fill #0
  Filled 2022-08-25: qty 90, 30d supply, fill #1
  Filled 2022-10-17: qty 90, 30d supply, fill #2

## 2022-08-10 MED ORDER — AUSTEDO 9 MG PO TABS
18.0000 mg | ORAL_TABLET | Freq: Two times a day (BID) | ORAL | 11 refills | Status: DC
  Filled 2022-08-10: qty 120, fill #0

## 2022-08-10 MED ORDER — ATOMOXETINE HCL 80 MG PO CAPS
80.0000 mg | ORAL_CAPSULE | Freq: Every day | ORAL | 3 refills | Status: DC
  Filled 2022-08-10 – 2022-09-21 (×3): qty 30, 30d supply, fill #0
  Filled 2022-10-17: qty 30, 30d supply, fill #1

## 2022-08-10 NOTE — Progress Notes (Signed)
[place] MD/PA/NP OP Progress Note Virtual Visit via Telephone Note  I connected with [name] on 08/10/22 at  3:00 PM EDT by telephone and verified that I am speaking with the correct person using two identifiers.  Location: Patient: home Provider: Clinic   I discussed the limitations, risks, security and privacy concerns of performing an evaluation and management service by telephone and the availability of in person appointments. I also discussed with the patient that there may be a patient responsible charge related to this service. The patient expressed understanding and agreed to proceed.   I provided 30 minutes of non-face-to-face time during this encounter.     08/10/2022 12:43 PM Oseph Breakiron  MRN:  [id number]  Chief Complaint:"My life is good"    HPI: 56 year old male seen today for follow-up psychiatric evaluation. Patient has a history of OCD, ADD, bipolar 1, tardive dyskinesia, antisocial personality disorder, substance use (sober 3 years), and PTSD and is currently managed on Straterra 80mg  daily (receives via patient care assistance), Austedo 18mg  twice daily  (receives via patient care assistance),and Prazosin 3mg  at bedtime.  Patient notes that he medications are effective in managing his psychiatric conditions.   Today unable to logon virtually so his visit was done over the phone.    During exam he was pleasant,cooperative, and engaged in conversation.  He informed Probation officer that life is good.  He notes that between his mental health care and receiving care at [facility] things are working in his favor.  He informed Probation officer that his mood is stable and reports that he has minimal anxiety and depression.  Today provider conducted a GAD-7 and patient scored a 2.  He reports that he is worried about his friend who wife recently died.  He informed Probation officer that he is now living with his friend to help him cope with his loss.  Provider also conducted PHQ-9 and patient scored a 0.  He endorses  adequate sleep and appetite.  Today he denies SI/HI/AVH, mania, paranoia.  No medication changes made today.  Patient agreeable to continue medication as prescribed.  No other concerns at this time.    Visit Diagnosis:     ICD-10-CM   1. Attention deficit hyperactivity disorder (ADHD), predominantly inattentive type  F90.0 atomoxetine (STRATTERA) 80 MG capsule    2. PTSD (post-traumatic stress disorder)  F43.10 prazosin (MINIPRESS) 1 MG capsule    3. Tardive dyskinesia  G24.01 Deutetrabenazine (AUSTEDO) 9 MG TABS      Past Psychiatric History: Patient reports he has a history of ADD, bipolar disorder, PTSD, OCD, substance use, and antisocial personality disorder   Past Medical History:  Past Medical History:  Diagnosis Date   PUD (peptic ulcer disease)     Past Surgical History:  Procedure Laterality Date   CERVICAL DISCECTOMY     MIDDLE EAR SURGERY      Family Psychiatric History: Mother adopted unknown mental issues. Niece ADHD, nephew autism, nephew schizophrenia and ADHD,  Family History: No family history on file.  Social History:  Social History   Socioeconomic History   Marital status: Single    Spouse name: Not on file   Number of children: Not on file   Years of education: Not on file   Highest education level: Not on file  Occupational History   Not on file  Tobacco Use   Smoking status: Every Day    Types: Cigarettes   Smokeless tobacco: Never  Substance and Sexual Activity   Alcohol use:  Yes   Drug use: Yes    Types: Marijuana   Sexual activity: Not on file  Other Topics Concern   Not on file  Social History Narrative   Not on file   Social Determinants of Health   Financial Resource Strain: Not on file  Food Insecurity: Not on file  Transportation Needs: Not on file  Physical Activity: Not on file  Stress: Not on file  Social Connections: Not on file    Allergies: Not on File  Metabolic Disorder Labs: No results found for: "HGBA1C",  "MPG" No results found for: "PROLACTIN" No results found for: "CHOL", "TRIG", "HDL", "CHOLHDL", "VLDL", "LDLCALC" No results found for: "TSH"  Therapeutic Level Labs: No results found for: "LITHIUM" No results found for: "VALPROATE" No results found for: "CBMZ"  Current Medications: Current Outpatient Medications  Medication Sig Dispense Refill   atomoxetine (STRATTERA) 80 MG capsule TAKE 1 CAPSULE (80 MG TOTAL) BY MOUTH DAILY. 30 capsule 3   Deutetrabenazine (AUSTEDO) 9 MG TABS Take 18 mg by mouth 2 (two) times daily. 120 tablet 11   nitroGLYCERIN (NITROSTAT) 0.4 MG SL tablet Place 0.4 mg under the tongue every 5 (five) minutes as needed for chest pain.     omeprazole (PRILOSEC) 40 MG capsule Take 1 capsule (40 mg total) by mouth daily. 30 capsule 0   prazosin (MINIPRESS) 1 MG capsule TAKE 3 CAPSULES (3 MG TOTAL) BY MOUTH AT BEDTIME. 90 capsule 3   sucralfate (CARAFATE) 1 g tablet Take 1 tablet (1 g total) by mouth 4 (four) times daily -  with meals and at bedtime. 40 tablet 0   No current facility-administered medications for this visit.     Musculoskeletal: Strength & Muscle Tone:  Unable to assess due to telephone visit Gait & Station:  Unable to assess due to telephone visit Patient leans: N/A  Psychiatric Specialty Exam: Review of Systems  There were no vitals taken for this visit.There is no height or weight on file to calculate BMI.  General Appearance:  Unable to assess due to telephone visit  Eye Contact:   Unable to assess due to telephone visit  Speech:  Clear and Coherent and Normal Rate  Volume:  Normal  Mood:  Euthymic  Affect:  Appropriate and Congruent  Thought Process:  Coherent, Goal Directed and Linear  Orientation:  Full (Time, Place, and Person)  Thought Content: WDL and Logical   Suicidal Thoughts:  No  Homicidal Thoughts:  No  Memory:  Immediate;   Good Recent;   Good Remote;   Good  Judgement:  Good  Insight:  Good  Psychomotor Activity:    Unable to assess due to telephone visit  Concentration:  Concentration: Good and Attention Span: Good  Recall:  Good  Fund of Knowledge: Good  Language: Good  Akathisia:   Unable to assess due to telephone visit  Handed:  Right  AIMS (if indicated): Notes  Assets:  Communication Skills Desire for Improvement Financial Resources/Insurance Housing Social Support  ADL's:  Intact  Cognition: WNL  Sleep:  Good   Screenings: GAD-7    Flowsheet Row Video Visit from 08/10/2022 in [facility] Video Visit from 01/17/2022 in [facility] Video Visit from 10/21/2021 in [facility] Video Visit from 07/21/2021 in [facility] Video Visit from 04/20/2021 in [facility]  Total GAD-7 Score 2 1 0 0 0      PHQ2-9  Flowsheet Row Video Visit from 08/10/2022 in [facility] Video Visit from 01/17/2022 in [facility] Video Visit from 10/21/2021 in [facility] Video Visit from 07/21/2021 in [facility] Video Visit from 04/20/2021 in [facility]  PHQ-2 Total Score 0 0 0 0 2  PHQ-9 Total Score 0 0 0 0 5        Assessment and Plan: Patient notes that he is doing well on his current medication regimen.  No medication changes made today.  Patient agreeable to continue medication as prescribed.    1. Tardive dyskinesia  Continue- Deutetrabenazine (AUSTEDO) 9 MG TABS; Take 18 mg by mouth 2 (two) times daily.  Dispense: 120 tablet; Refill: 11  2. Attention deficit hyperactivity disorder (ADHD), predominantly inattentive type  Continue- atomoxetine (STRATTERA) 80 MG capsule; TAKE 1 CAPSULE (80 MG TOTAL) BY MOUTH DAILY.  Dispense: 30 capsule; Refill: 3  3. PTSD (post-traumatic stress disorder)  Continue- prazosin (MINIPRESS) 1 MG  capsule; TAKE 3 CAPSULES (3 MG TOTAL) BY MOUTH AT BEDTIME.  Dispense: 90 capsule; Refill: 3     Follow-up in 3 months  Shanna Cisco, NP 08/10/2022, 12:43 PM

## 2022-08-25 ENCOUNTER — Other Ambulatory Visit: Payer: Self-pay

## 2022-09-21 ENCOUNTER — Other Ambulatory Visit: Payer: Self-pay

## 2022-09-22 ENCOUNTER — Other Ambulatory Visit: Payer: Self-pay

## 2022-10-04 ENCOUNTER — Other Ambulatory Visit: Payer: Self-pay

## 2022-10-17 ENCOUNTER — Other Ambulatory Visit: Payer: Self-pay

## 2022-11-03 ENCOUNTER — Encounter (HOSPITAL_COMMUNITY): Payer: Self-pay | Admitting: Psychiatry

## 2022-11-03 ENCOUNTER — Telehealth (INDEPENDENT_AMBULATORY_CARE_PROVIDER_SITE_OTHER): Payer: No Payment, Other | Admitting: Psychiatry

## 2022-11-03 DIAGNOSIS — F900 Attention-deficit hyperactivity disorder, predominantly inattentive type: Secondary | ICD-10-CM

## 2022-11-03 DIAGNOSIS — G2401 Drug induced subacute dyskinesia: Secondary | ICD-10-CM | POA: Diagnosis not present

## 2022-11-03 DIAGNOSIS — F4310 Post-traumatic stress disorder, unspecified: Secondary | ICD-10-CM

## 2022-11-03 MED ORDER — PRAZOSIN HCL 1 MG PO CAPS: ORAL_CAPSULE | ORAL | 3 refills | Status: DC

## 2022-11-03 MED ORDER — ATOMOXETINE HCL 80 MG PO CAPS: 80.0000 mg | ORAL_CAPSULE | Freq: Every day | ORAL | 3 refills | Status: DC

## 2022-11-03 MED ORDER — AUSTEDO 9 MG PO TABS: 18.0000 mg | ORAL_TABLET | Freq: Two times a day (BID) | ORAL | 11 refills | Status: DC

## 2022-11-03 NOTE — Progress Notes (Signed)
[facility] MD/PA/NP OP Progress Note Virtual Visit via Telephone Note  I connected with Fernando Garrison on 11/03/22 at  3:30 PM EST by telephone and verified that I am speaking with the correct person using two identifiers.  Location: Patient: home Provider: Clinic   I discussed the limitations, risks, security and privacy concerns of performing an evaluation and management service by telephone and the availability of in person appointments. I also discussed with the patient that there may be a patient responsible charge related to this service. The patient expressed understanding and agreed to proceed.   I provided 30 minutes of non-face-to-face time during this encounter.     11/03/2022 11:38 AM Fernando Garrison  MRN:  [id number]  Chief Complaint:"Things are well"    HPI: 56 year old male seen today for follow-up psychiatric evaluation. Patient has a history of OCD, ADD, bipolar 1, tardive dyskinesia, antisocial personality disorder, substance use (sober 3 years), and PTSD and is currently managed on Straterra 80mg  daily (receives via patient care assistance), Austedo 18mg  twice daily  (receives via patient care assistance),and Prazosin 3mg  at bedtime.  Patient notes that he medications are effective in managing his psychiatric conditions.   Today unable to logon virtually so his visit was done over the phone.    During exam he was pleasant,cooperative, and engaged in conversation.  He informed that things are well.  He notes that he continues to go to work and at times finds work stressful but reports that he is able to cope with it.  Provider asked patient if he continues to have abnormal muscle movements since stopping Abilify and starting Austedo. He notes that he does not.  Provider informed patient that in the future I start Austedo could be discontinued/tapered as he is no longer on antipsychotic and symptoms of TD are not be present.  He endorsed understanding however notes that he was  skeptical to do this because he finds it effective with his other medications.  Today patient informed writer that his anxiety and depression are well managed.  Provider conducted a GAD-7 and patient scored a 1, at his last visit he scored 2.  Provider also conducted PHQ-9 patient scored a 0, at his last visit he scored 0.  He endorsed adequate sleep and increased appetite.  Today he denies SI/HI/VAH, mania, paranoia.    Patient informed that he continues to go to [facility] and is maintaining his sobriety.    No medication changes made today.  Patient agreeable to continue medications as prescribed.  No other concerns at this time.      Visit Diagnosis:     ICD-10-CM   1. Attention deficit hyperactivity disorder (ADHD), predominantly inattentive type  F90.0 atomoxetine (STRATTERA) 80 MG capsule    2. Tardive dyskinesia  G24.01 Deutetrabenazine (AUSTEDO) 9 MG TABS    3. PTSD (post-traumatic stress disorder)  F43.10 prazosin (MINIPRESS) 1 MG capsule      Past Psychiatric History: Patient reports he has a history of ADD, bipolar disorder, PTSD, OCD, substance use, and antisocial personality disorder   Past Medical History:  Past Medical History:  Diagnosis Date   PUD (peptic ulcer disease)     Past Surgical History:  Procedure Laterality Date   CERVICAL DISCECTOMY     MIDDLE EAR SURGERY      Family Psychiatric History: Mother adopted unknown mental issues. Niece ADHD, nephew autism, nephew schizophrenia and ADHD,  Family History: History reviewed. No pertinent family history.  Social History:  Social  History   Socioeconomic History   Marital status: Single    Spouse name: Not on file   Number of children: Not on file   Years of education: Not on file   Highest education level: Not on file  Occupational History   Not on file  Tobacco Use   Smoking status: Every Day    Types: Cigarettes   Smokeless tobacco: Never  Substance and Sexual Activity   Alcohol use: Yes   Drug  use: Yes    Types: Marijuana   Sexual activity: Not on file  Other Topics Concern   Not on file  Social History Narrative   Not on file   Social Determinants of Health   Financial Resource Strain: Not on file  Food Insecurity: Not on file  Transportation Needs: Not on file  Physical Activity: Not on file  Stress: Not on file  Social Connections: Not on file    Allergies: Not on File  Metabolic Disorder Labs: No results found for: "HGBA1C", "MPG" No results found for: "PROLACTIN" No results found for: "CHOL", "TRIG", "HDL", "CHOLHDL", "VLDL", "LDLCALC" No results found for: "TSH"  Therapeutic Level Labs: No results found for: "LITHIUM" No results found for: "VALPROATE" No results found for: "CBMZ"  Current Medications: Current Outpatient Medications  Medication Sig Dispense Refill   atomoxetine (STRATTERA) 80 MG capsule Take 1 capsule (80 mg total) by mouth daily. 30 capsule 3   Deutetrabenazine (AUSTEDO) 9 MG TABS Take 18 mg by mouth 2 (two) times daily. 120 tablet 11   nitroGLYCERIN (NITROSTAT) 0.4 MG SL tablet Place 0.4 mg under the tongue every 5 (five) minutes as needed for chest pain.     omeprazole (PRILOSEC) 40 MG capsule Take 1 capsule (40 mg total) by mouth daily. 30 capsule 0   prazosin (MINIPRESS) 1 MG capsule TAKE 3 CAPSULES (3 MG TOTAL) BY MOUTH AT BEDTIME. 90 capsule 3   sucralfate (CARAFATE) 1 g tablet Take 1 tablet (1 g total) by mouth 4 (four) times daily -  with meals and at bedtime. 40 tablet 0   No current facility-administered medications for this visit.     Musculoskeletal: Strength & Muscle Tone:  Unable to assess due to telephone visit Gait & Station:  Unable to assess due to telephone visit Patient leans: N/A  Psychiatric Specialty Exam: Review of Systems  There were no vitals taken for this visit.There is no height or weight on file to calculate BMI.  General Appearance:  Unable to assess due to telephone visit  Eye Contact:   Unable to  assess due to telephone visit  Speech:  Clear and Coherent and Normal Rate  Volume:  Normal  Mood:  Euthymic  Affect:  Appropriate and Congruent  Thought Process:  Coherent, Goal Directed and Linear  Orientation:  Full (Time, Place, and Person)  Thought Content: WDL and Logical   Suicidal Thoughts:  No  Homicidal Thoughts:  No  Memory:  Immediate;   Good Recent;   Good Remote;   Good  Judgement:  Good  Insight:  Good  Psychomotor Activity:   Unable to assess due to telephone visit  Concentration:  Concentration: Good and Attention Span: Good  Recall:  Good  Fund of Knowledge: Good  Language: Good  Akathisia:   Unable to assess due to telephone visit  Handed:  Right  AIMS (if indicated): Notes  Assets:  Communication Skills Desire for Improvement Financial Resources/Insurance Housing Social Support  ADL's:  Intact  Cognition: WNL  Sleep:  Good   Screenings: GAD-7    Flowsheet Row Video Visit from 11/03/2022 in [facility] Video Visit from 08/10/2022 in [facility] Video Visit from 01/17/2022 in [facility] Video Visit from 10/21/2021 in [facility] Video Visit from 07/21/2021 in [facility]  Total GAD-7 Score 1 2 1  0 0      PHQ2-9    Flowsheet Row Video Visit from 11/03/2022 in [facility] Video Visit from 08/10/2022 in [facility] Video Visit from 01/17/2022 in [facility] Video Visit from 10/21/2021 in [facility] Video Visit from 07/21/2021 in [facility]  PHQ-2 Total Score 0 0 0 0 0  PHQ-9 Total Score 0 0 0 0 0        Assessment and Plan: Patient notes that he is doing well on his current medication regimen.  No medication changes made today.  Patient agreeable to continue medication as  prescribed.    1. Tardive dyskinesia  Continue- Deutetrabenazine (AUSTEDO) 9 MG TABS; Take 18 mg by mouth 2 (two) times daily.  Dispense: 120 tablet; Refill: 11  2. Attention deficit hyperactivity disorder (ADHD), predominantly inattentive type  Continue- atomoxetine (STRATTERA) 80 MG capsule; TAKE 1 CAPSULE (80 MG TOTAL) BY MOUTH DAILY.  Dispense: 30 capsule; Refill: 3  3. PTSD (post-traumatic stress disorder)  Continue- prazosin (MINIPRESS) 1 MG capsule; TAKE 3 CAPSULES (3 MG TOTAL) BY MOUTH AT BEDTIME.  Dispense: 90 capsule; Refill: 3     Follow-up in 3 months  [facility], NP 11/03/2022, 11:38 AM

## 2022-11-18 ENCOUNTER — Other Ambulatory Visit: Payer: Self-pay

## 2022-11-21 ENCOUNTER — Other Ambulatory Visit (HOSPITAL_COMMUNITY): Payer: Self-pay | Admitting: Psychiatry

## 2022-11-21 ENCOUNTER — Other Ambulatory Visit: Payer: Self-pay

## 2022-11-21 DIAGNOSIS — F4310 Post-traumatic stress disorder, unspecified: Secondary | ICD-10-CM

## 2022-11-21 DIAGNOSIS — F900 Attention-deficit hyperactivity disorder, predominantly inattentive type: Secondary | ICD-10-CM

## 2022-11-21 MED ORDER — ATOMOXETINE HCL 80 MG PO CAPS
80.0000 mg | ORAL_CAPSULE | Freq: Every day | ORAL | 3 refills | Status: DC
  Filled 2022-11-21: qty 30, 30d supply, fill #0
  Filled 2022-12-16: qty 30, 30d supply, fill #1
  Filled 2023-01-16: qty 30, 30d supply, fill #2
  Filled 2023-02-20: qty 30, 30d supply, fill #3

## 2022-11-21 MED ORDER — PRAZOSIN HCL 1 MG PO CAPS
3.0000 mg | ORAL_CAPSULE | Freq: Every day | ORAL | 3 refills | Status: DC
  Filled 2022-11-21: qty 90, 30d supply, fill #0
  Filled 2022-12-16 (×2): qty 90, 30d supply, fill #1
  Filled 2023-01-16: qty 90, 30d supply, fill #2
  Filled 2023-02-20: qty 90, 30d supply, fill #3

## 2022-11-23 ENCOUNTER — Other Ambulatory Visit: Payer: Self-pay

## 2022-11-24 ENCOUNTER — Other Ambulatory Visit: Payer: Self-pay

## 2022-12-16 ENCOUNTER — Other Ambulatory Visit: Payer: Self-pay

## 2022-12-16 ENCOUNTER — Ambulatory Visit (HOSPITAL_COMMUNITY)
Admission: EM | Admit: 2022-12-16 | Discharge: 2022-12-16 | Disposition: A | Discharge status: Home / Self Care | Payer: Self-pay | Attending: Emergency Medicine | Admitting: Emergency Medicine

## 2022-12-16 ENCOUNTER — Encounter (HOSPITAL_COMMUNITY): Payer: Self-pay

## 2022-12-16 DIAGNOSIS — K029 Dental caries, unspecified: Secondary | ICD-10-CM

## 2022-12-16 DIAGNOSIS — R220 Localized swelling, mass and lump, head: Secondary | ICD-10-CM

## 2022-12-16 DIAGNOSIS — B349 Viral infection, unspecified: Secondary | ICD-10-CM

## 2022-12-16 MED ORDER — AMOXICILLIN-POT CLAVULANATE 875-125 MG PO TABS
1.0000 | ORAL_TABLET | Freq: Two times a day (BID) | ORAL | 0 refills | Status: DC
  Filled 2022-12-16: qty 14, 7d supply, fill #0

## 2022-12-16 NOTE — Discharge Instructions (Signed)
Dental pain and Facial swelling: Augmentin has been sent to the pharmacy, you will take this 2 times a day for the next 7 days.  Please make sure to finish all of the antibiotics even if symptoms improve before completion.  Please avoid taking this medication on an empty stomach.  Please make sure to schedule follow-up with a dentist.   Viral illness:  Viral illnesses can take 7 to 10 days to start seeing a resolution in your symptoms.  You may use Flonase over-the-counter to help with nasal congestion.  Please make sure to stay adequately hydrated drinking at least 8 cups of water daily. Please follow up if your symptoms are not improving.

## 2022-12-16 NOTE — ED Triage Notes (Signed)
Swelling to left side of face, pt states impacted tooth that started swelling yesterday, now having sore throat, cough, headache, sinus pressure started Monday. taking tylenol and ibuprofen 1200 every 4 hours, educated pt about rotating tylenol and ibuprofen and doing 600 of ibuprofen instead of the 1200.

## 2022-12-16 NOTE — ED Provider Notes (Signed)
[facility]    CSN: [id number] Arrival date & time: 12/16/22  2595      History   Chief Complaint Chief Complaint  Patient presents with   Dental Pain   Cough   Sore Throat    HPI Fernando Garrison is a 56 y.o. male.  Patient presents complaining of left-sided lower dental pain and left-sided facial swelling.  He stated that he has a fractured tooth that has been ongoing for over a year, he states that the facial swelling recently started a few days ago.  He reports having increased pain dental pain. He denies any drainage from site.  He reports that he has not seen a dentist for this issue.   He reports sore throat and nasal congestion that started 4-5 days ago. He states that he has an exposure to a viral illness of unknown etiology. He reports increases sinus pressure and rhinorrhea.  He reports that he has taken ibuprofen with minimal relief of symptoms.      Dental Pain Associated symptoms: congestion   Associated symptoms: no fever   Cough Associated symptoms: ear pain (Bilateral, more pain in left ear), rhinorrhea and sore throat   Associated symptoms: no chest pain, no chills, no fever, no myalgias, no shortness of breath and no wheezing   Sore Throat Pertinent negatives include no chest pain and no shortness of breath.    Past Medical History:  Diagnosis Date   PUD (peptic ulcer disease)     Patient Active Problem List   Diagnosis Date Noted   Generalized anxiety disorder 09/25/2020   Attention deficit hyperactivity disorder (ADHD), predominantly inattentive type 08/12/2020   Bipolar I disorder, most recent episode depressed (HCC) 08/12/2020   PTSD (post-traumatic stress disorder) 08/12/2020   Tardive dyskinesia 08/12/2020    Past Surgical History:  Procedure Laterality Date   CERVICAL DISCECTOMY     MIDDLE EAR SURGERY         Home Medications    Prior to Admission medications   Medication Sig Start Date End Date Taking? Authorizing  Provider  amoxicillin-clavulanate (AUGMENTIN) 875-125 MG tablet Take 1 tablet by mouth every 12 (twelve) hours. 12/16/22  Yes Debby Freiberg, NP  atomoxetine (STRATTERA) 80 MG capsule Take 1 capsule (80 mg total) by mouth daily. 11/03/22  Yes Toy Cookey E, NP  atomoxetine (STRATTERA) 80 MG capsule Take 1 capsule (80 mg total) by mouth daily. 11/03/22  Yes Toy Cookey E, NP  Deutetrabenazine (AUSTEDO) 9 MG TABS Take 18 mg by mouth 2 (two) times daily. 11/03/22  Yes Toy Cookey E, NP  prazosin (MINIPRESS) 1 MG capsule TAKE 3 CAPSULES (3 MG TOTAL) BY MOUTH AT BEDTIME. 11/03/22  Yes Toy Cookey E, NP  prazosin (MINIPRESS) 1 MG capsule Take 3 capsules (3 mg total) by mouth at bedtime. 11/03/22  Yes Toy Cookey E, NP  nitroGLYCERIN (NITROSTAT) 0.4 MG SL tablet Place 0.4 mg under the tongue every 5 (five) minutes as needed for chest pain.    [provider]  omeprazole (PRILOSEC) 40 MG capsule Take 1 capsule (40 mg total) by mouth daily. 11/17/19   Molpus, Khadar, MD  sucralfate (CARAFATE) 1 g tablet Take 1 tablet (1 g total) by mouth 4 (four) times daily -  with meals and at bedtime. 11/17/19   Molpus, Janiel, MD  ARIPiprazole (ABILIFY) 5 MG tablet Take 1 tablet (5 mg total) by mouth daily. 01/21/21 02/16/21  Shanna Cisco, NP  benztropine (COGENTIN) 0.5 MG tablet Take 2  tablets (1 mg total) by mouth 2 (two) times daily. 09/25/20 01/21/21  Shanna Cisco, NP    Family History History reviewed. No pertinent family history.  Social History Social History   Tobacco Use   Smoking status: Every Day    Types: Cigarettes   Smokeless tobacco: Never  Substance Use Topics   Alcohol use: Yes   Drug use: Yes    Types: Marijuana     Allergies   Patient has no known allergies.   Review of Systems Review of Systems  Constitutional:  Negative for activity change, appetite change, chills, fatigue and fever.  HENT:  Positive for congestion, dental problem,  ear pain (Bilateral, more pain in left ear), rhinorrhea, sinus pressure and sore throat. Negative for ear discharge, postnasal drip, sinus pain and trouble swallowing.        Left sided facial swelling and pain   Eyes: Negative.   Respiratory:  Positive for cough (productive cough). Negative for chest tightness, shortness of breath and wheezing.   Cardiovascular:  Negative for chest pain and palpitations.  Gastrointestinal: Negative.   Musculoskeletal:  Negative for myalgias.     Physical Exam Triage Vital Signs ED Triage Vitals  Enc Vitals Group     BP 12/16/22 1120 (!) 134/90     Pulse Rate 12/16/22 1120 85     Resp 12/16/22 1120 18     Temp 12/16/22 1120 98 F (36.7 C)     Temp Source 12/16/22 1120 Oral     SpO2 12/16/22 1120 95 %     Weight --      Height --      Head Circumference --      Peak Flow --      Pain Score 12/16/22 1121 7     Pain Loc --      Pain Edu? --      Excl. in GC? --    No data found.  Updated Vital Signs BP (!) 134/90 (BP Location: Right Arm)   Pulse 85   Temp 98 F (36.7 C) (Oral)   Resp 18   SpO2 95%   Physical Exam Vitals and nursing note reviewed.  HENT:     Right Ear: Hearing, ear canal and external ear normal. No tenderness. Tympanic membrane is bulging. Tympanic membrane is not injected or erythematous.     Left Ear: Hearing, ear canal and external ear normal. No tenderness. Tympanic membrane is bulging. Tympanic membrane is not injected or erythematous.     Nose: Rhinorrhea present. No congestion. Rhinorrhea is clear.     Right Turbinates: Not enlarged, swollen or pale.     Left Turbinates: Enlarged and swollen. Not pale.     Right Sinus: No maxillary sinus tenderness or frontal sinus tenderness.     Left Sinus: No maxillary sinus tenderness or frontal sinus tenderness.     Mouth/Throat:     Mouth: Mucous membranes are moist.     Dentition: Abnormal dentition. Dental tenderness, gingival swelling and dental caries present.      Pharynx: Oropharynx is clear. Uvula midline. No pharyngeal swelling, oropharyngeal exudate, posterior oropharyngeal erythema or uvula swelling.     Tonsils: No tonsillar exudate or tonsillar abscesses. 0 on the right. 0 on the left.      Comments: Left sided facial swelling along lower jaw line. Tenderness upon palpation Cardiovascular:     Rate and Rhythm: Normal rate and regular rhythm.     Heart sounds: Normal heart sounds, S1  normal and S2 normal.  Pulmonary:     Effort: Pulmonary effort is normal.     Breath sounds: Normal breath sounds and air entry. No decreased breath sounds, wheezing, rhonchi or rales.  Lymphadenopathy:     Cervical: No cervical adenopathy.  Neurological:     Mental Status: He is alert.   Patient was evaluated for   UC Treatments / Results  Labs (all labs ordered are listed, but only abnormal results are displayed) Labs Reviewed - No data to display  EKG   Radiology No results found.  Procedures Procedures (including critical care time)  Medications Ordered in UC Medications - No data to display  Initial Impression / Assessment and Plan / UC Course  I have reviewed the triage vital signs and the nursing notes.  Pertinent labs & imaging results that were available during my care of the patient were reviewed by me and considered in my medical decision making (see chart for details).     Patient was evaluated for dental caries and facial swelling.  Augmentin was sent to the pharmacy, patient was made aware of medication regiment.  Patient was made aware that he will need to follow-up with a dentist, patient states he has an office in mind that he will follow-up with.  Patient states that he does not have dental insurance, he was given information for low-cost dental resources. Patient was evaluated for viral illness.  Patient was made aware of symptom management for viral illness.  Patient made aware of timeline for symptom resolution and when  follow-up was necessary.  Patient verbalized understanding of instructions.  Charting was provided using a a verbal dictation system, charting was proofread for errors, errors may occur which could change the meaning of the information charted.   Final Clinical Impressions(s) / UC Diagnoses   Final diagnoses:  Pain due to dental caries  Left facial swelling  Viral illness     Discharge Instructions      Dental pain and Facial swelling: Augmentin has been sent to the pharmacy, you will take this 2 times a day for the next 7 days.  Please make sure to finish all of the antibiotics even if symptoms improve before completion.  Please avoid taking this medication on an empty stomach.  Please make sure to schedule follow-up with a dentist.   Viral illness:  Viral illnesses can take 7 to 10 days to start seeing a resolution in your symptoms.  You may use Flonase over-the-counter to help with nasal congestion.  Please make sure to stay adequately hydrated drinking at least 8 cups of water daily. Please follow up if your symptoms are not improving.      ED Prescriptions     Medication Sig Dispense Auth. Provider   amoxicillin-clavulanate (AUGMENTIN) 875-125 MG tablet Take 1 tablet by mouth every 12 (twelve) hours. 14 tablet Debby Freiberg, NP      PDMP not reviewed this encounter.   Debby Freiberg, NP 12/16/22 1228

## 2023-01-16 ENCOUNTER — Other Ambulatory Visit: Payer: Self-pay

## 2023-01-17 ENCOUNTER — Other Ambulatory Visit: Payer: Self-pay

## 2023-01-31 ENCOUNTER — Encounter (HOSPITAL_COMMUNITY): Payer: Self-pay | Admitting: Psychiatry

## 2023-01-31 ENCOUNTER — Telehealth (INDEPENDENT_AMBULATORY_CARE_PROVIDER_SITE_OTHER): Payer: No Payment, Other | Admitting: Psychiatry

## 2023-01-31 DIAGNOSIS — F900 Attention-deficit hyperactivity disorder, predominantly inattentive type: Secondary | ICD-10-CM

## 2023-01-31 DIAGNOSIS — G2401 Drug induced subacute dyskinesia: Secondary | ICD-10-CM | POA: Diagnosis not present

## 2023-01-31 MED ORDER — PRAZOSIN HCL 1 MG PO CAPS
3.0000 mg | ORAL_CAPSULE | Freq: Every day | ORAL | 3 refills | Status: DC
  Filled 2023-03-24: qty 90, 30d supply, fill #0

## 2023-01-31 MED ORDER — ATOMOXETINE HCL 80 MG PO CAPS
80.0000 mg | ORAL_CAPSULE | Freq: Every day | ORAL | 3 refills | Status: DC
  Filled 2023-03-24: qty 30, 30d supply, fill #0

## 2023-01-31 MED ORDER — AUSTEDO 9 MG PO TABS: 18.0000 mg | ORAL_TABLET | Freq: Two times a day (BID) | ORAL | 11 refills | Status: DC

## 2023-01-31 NOTE — Progress Notes (Signed)
[facility] MD/PA/NP OP Progress Note Virtual Visit via Video Note  I connected with Fernando Garrison on 01/31/23 at  3:30 PM EST by a video enabled telemedicine application and verified that I am speaking with the correct person using two identifiers.  Location: Patient: car Provider: Clinic   I discussed the limitations of evaluation and management by telemedicine and the availability of in person appointments. The patient expressed understanding and agreed to proceed.  I provided 30 minutes of non-face-to-face time during this encounter.       01/31/2023 3:26 PM Fernando Garrison  MRN:  [id number]  Chief Complaint:"Things are really well"    HPI: 57 year old male seen today for follow-up psychiatric evaluation. Patient has a history of OCD, ADD, bipolar 1, tardive dyskinesia, antisocial personality disorder, substance use (sober 3 years), and PTSD and is currently managed on Straterra 89m daily (receives via patient care assistance), Austedo 174mtwice daily  (receives via patient care assistance),and Prazosin 47m14mt bedtime.  Patient notes that he medications are effective in managing his psychiatric conditions.   Today he was well-groomed, visit, cooperative, and engaged in conversation.  He informed wriProbation officerat things are going really well.  He notes that his mood is stable and denies symptoms of anxiety and depression.  Today provider conducted a GAD-7 and patient scored a 0, at his last visit he scored a 1.  Provider also conducted PHQ-9 and patient scored a 0, at his last visit he scored a 0.  He endorses adequate sleep and appetite.  Today he denies SI/HI/AVH, mania, paranoia.      Provider informed patient that  Austedo could be discontinued/tapered as he is no longer on antipsychotic and symptoms of TD are not be present.  He informed wriProbation officerat recently he ran out of his Austedo and began to have symptoms of TD.  At this time he requested to continue the medication as he finds it effective.   Provider was agreeable to this.    Patient informed wriProbation officerat he continues to maintain his sobriety.  He informed wriProbation officerat he will be visiting [place] attend a recovery convention.  He notes that he is excited about this.  No medication changes made today.  Patient agreeable to continue medications as prescribed.  No other concerns at this time.      Visit Diagnosis:     ICD-10-CM   1. Attention deficit hyperactivity disorder (ADHD), predominantly inattentive type  F90.0 atomoxetine (STRATTERA) 80 MG capsule    2. Tardive dyskinesia  G24.01 Deutetrabenazine (AUSTEDO) 9 MG TABS      Past Psychiatric History: Patient reports he has a history of ADD, bipolar disorder, PTSD, OCD, substance use, and antisocial personality disorder   Past Medical History:  Past Medical History:  Diagnosis Date   PUD (peptic ulcer disease)     Past Surgical History:  Procedure Laterality Date   CERVICAL DISCECTOMY     MIDDLE EAR SURGERY      Family Psychiatric History: Mother adopted unknown mental issues. Niece ADHD, nephew autism, nephew schizophrenia and ADHD,  Family History: History reviewed. No pertinent family history.  Social History:  Social History   Socioeconomic History   Marital status: Single    Spouse name: Not on file   Number of children: Not on file   Years of education: Not on file   Highest education level: Not on file  Occupational History   Not on file  Tobacco Use   Smoking status: Every  Day    Types: Cigarettes   Smokeless tobacco: Never  Substance and Sexual Activity   Alcohol use: Yes   Drug use: Yes    Types: Marijuana   Sexual activity: Not on file  Other Topics Concern   Not on file  Social History Narrative   Not on file   Social Determinants of Health   Financial Resource Strain: Not on file  Food Insecurity: Not on file  Transportation Needs: Not on file  Physical Activity: Not on file  Stress: Not on file  Social Connections: Not on  file    Allergies: No Known Allergies  Metabolic Disorder Labs: No results found for: "HGBA1C", "MPG" No results found for: "PROLACTIN" No results found for: "CHOL", "TRIG", "HDL", "CHOLHDL", "VLDL", "LDLCALC" No results found for: "TSH"  Therapeutic Level Labs: No results found for: "LITHIUM" No results found for: "VALPROATE" No results found for: "CBMZ"  Current Medications: Current Outpatient Medications  Medication Sig Dispense Refill   amoxicillin-clavulanate (AUGMENTIN) 875-125 MG tablet Take 1 tablet by mouth every 12 (twelve) hours. 14 tablet 0   atomoxetine (STRATTERA) 80 MG capsule Take 1 capsule (80 mg total) by mouth daily. 30 capsule 3   atomoxetine (STRATTERA) 80 MG capsule Take 1 capsule (80 mg total) by mouth daily. 30 capsule 3   Deutetrabenazine (AUSTEDO) 9 MG TABS Take 18 mg by mouth 2 (two) times daily. 120 tablet 11   nitroGLYCERIN (NITROSTAT) 0.4 MG SL tablet Place 0.4 mg under the tongue every 5 (five) minutes as needed for chest pain.     omeprazole (PRILOSEC) 40 MG capsule Take 1 capsule (40 mg total) by mouth daily. 30 capsule 0   prazosin (MINIPRESS) 1 MG capsule Take 3 capsules (3 mg total) by mouth at bedtime. 90 capsule 3   sucralfate (CARAFATE) 1 g tablet Take 1 tablet (1 g total) by mouth 4 (four) times daily -  with meals and at bedtime. 40 tablet 0   No current facility-administered medications for this visit.     Musculoskeletal: Strength & Muscle Tone: within normal limits and telehealth visit Gait & Station: normal, telehealth visit Patient leans: N/A  Psychiatric Specialty Exam: Review of Systems  There were no vitals taken for this visit.There is no height or weight on file to calculate BMI.  General Appearance: Well Groomed  Eye Contact:  Good  Speech:  Clear and Coherent and Normal Rate  Volume:  Normal  Mood:  Euthymic  Affect:  Appropriate and Congruent  Thought Process:  Coherent, Goal Directed and Linear  Orientation:  Full  (Time, Place, and Person)  Thought Content: WDL and Logical   Suicidal Thoughts:  No  Homicidal Thoughts:  No  Memory:  Immediate;   Good Recent;   Good Remote;   Good  Judgement:  Good  Insight:  Good  Psychomotor Activity:  Normal  Concentration:  Concentration: Good and Attention Span: Good  Recall:  Good  Fund of Knowledge: Good  Language: Good  Akathisia:  No  Handed:  Right  AIMS (if indicated): Notes  Assets:  Communication Skills Desire for Improvement Financial Resources/Insurance Housing Social Support  ADL's:  Intact  Cognition: WNL  Sleep:  Good   Screenings: GAD-7    Flowsheet Row Video Visit from 01/31/2023 in [facility] Video Visit from 11/03/2022 in [facility] Video Visit from 08/10/2022 in [facility] Video Visit from 01/17/2022 in [facility] Video Visit  from 10/21/2021 in [facility]  Total GAD-7 Score 0 1 2 1 $ 0      PHQ2-9    Flowsheet Row Video Visit from 01/31/2023 in [facility] Video Visit from 11/03/2022 in [facility] Video Visit from 08/10/2022 in [facility] Video Visit from 01/17/2022 in [facility] Video Visit from 10/21/2021 in [facility]  PHQ-2 Total Score 0 0 0 0 0  PHQ-9 Total Score 0 0 0 0 0      Flowsheet Row ED from 12/16/2022 in [facility] [place] No Risk        Assessment and Plan: Patient notes that he is doing well on his current medication regimen.  No medication changes made today.  Patient agreeable to continue medication as prescribed.    1. Tardive dyskinesia  Continue- Deutetrabenazine (AUSTEDO) 9 MG TABS; Take 18 mg by mouth 2 (two) times daily.  Dispense: 120 tablet; Refill: 11  2. Attention  deficit hyperactivity disorder (ADHD), predominantly inattentive type  Continue- atomoxetine (STRATTERA) 80 MG capsule; TAKE 1 CAPSULE (80 MG TOTAL) BY MOUTH DAILY.  Dispense: 30 capsule; Refill: 3  3. PTSD (post-traumatic stress disorder)  Continue- prazosin (MINIPRESS) 1 MG capsule; TAKE 3 CAPSULES (3 MG TOTAL) BY MOUTH AT BEDTIME.  Dispense: 90 capsule; Refill: 3     Follow-up in 3 months  Salley Slaughter, NP 01/31/2023, 3:26 PM

## 2023-02-20 ENCOUNTER — Other Ambulatory Visit: Payer: Self-pay

## 2023-02-20 ENCOUNTER — Other Ambulatory Visit (HOSPITAL_COMMUNITY): Payer: Self-pay

## 2023-02-22 ENCOUNTER — Other Ambulatory Visit: Payer: Self-pay

## 2023-03-14 ENCOUNTER — Telehealth: Payer: Self-pay

## 2023-03-14 ENCOUNTER — Other Ambulatory Visit: Payer: Self-pay

## 2023-03-14 NOTE — Telephone Encounter (Signed)
Patient called my line directly and asked that I relay the message that he would like a returned phone call regarding something related to a court case he is currently involved in. [phone]

## 2023-03-17 ENCOUNTER — Other Ambulatory Visit: Payer: Self-pay

## 2023-03-17 ENCOUNTER — Encounter (HOSPITAL_COMMUNITY): Payer: Self-pay | Admitting: Psychiatry

## 2023-03-17 NOTE — Telephone Encounter (Signed)
Patient informed Probation officer that he needs documentation of his past and current mental health care to present to his lawyer for his disability hearing.  Provider wrote letter describing patient transition since his initial visit in 2021 to current.  Provider informed patient that he could access this letter from his [place].  He endorsed understanding and agreed.  No other concerns at this time.

## 2023-03-24 ENCOUNTER — Other Ambulatory Visit (HOSPITAL_COMMUNITY): Payer: Self-pay | Admitting: Psychiatry

## 2023-03-24 ENCOUNTER — Other Ambulatory Visit: Payer: Self-pay

## 2023-03-27 ENCOUNTER — Other Ambulatory Visit: Payer: Self-pay

## 2023-03-27 MED ORDER — PRAZOSIN HCL 1 MG PO CAPS
3.0000 mg | ORAL_CAPSULE | Freq: Every day | ORAL | 3 refills | Status: DC
  Filled 2023-03-27: qty 90, 30d supply, fill #0

## 2023-03-27 MED ORDER — ATOMOXETINE HCL 80 MG PO CAPS
80.0000 mg | ORAL_CAPSULE | Freq: Every day | ORAL | 3 refills | Status: DC
  Filled 2023-03-27: qty 30, 30d supply, fill #0

## 2023-04-05 ENCOUNTER — Other Ambulatory Visit (HOSPITAL_COMMUNITY): Payer: Self-pay | Admitting: *Deleted

## 2023-04-05 ENCOUNTER — Other Ambulatory Visit: Payer: Self-pay

## 2023-04-05 NOTE — Progress Notes (Signed)
Fernando Garrison 57 year old male August 01, 1966 Comm Pref:  [address] [place] [address]   [phone] (H)      Medications   amoxicillin-clavulanate (AUGMENTIN) 875-125 MG tablet  atomoxetine (STRATTERA) 80 MG capsule  atomoxetine (STRATTERA) 80 MG capsule  Deutetrabenazine (AUSTEDO) 9 MG TABS  nitroGLYCERIN (NITROSTAT) 0.4 MG SL tablet  omeprazole (PRILOSEC) 40 MG capsule  prazosin (MINIPRESS) 1 MG capsule  prazosin (MINIPRESS) 1 MG capsule  sucralfate (CARAFATE) 1 g tablet

## 2023-04-11 ENCOUNTER — Other Ambulatory Visit (HOSPITAL_COMMUNITY): Payer: Self-pay | Admitting: Psychiatry

## 2023-04-11 ENCOUNTER — Other Ambulatory Visit: Payer: Self-pay

## 2023-04-11 DIAGNOSIS — G2401 Drug induced subacute dyskinesia: Secondary | ICD-10-CM

## 2023-04-11 MED ORDER — AUSTEDO 9 MG PO TABS
18.0000 mg | ORAL_TABLET | Freq: Two times a day (BID) | ORAL | 11 refills | Status: DC
Start: 2023-04-11 — End: 2023-04-11

## 2023-04-11 MED ORDER — AUSTEDO 9 MG PO TABS: 18.0000 mg | ORAL_TABLET | Freq: Two times a day (BID) | ORAL | 11 refills | Status: DC

## 2023-04-12 ENCOUNTER — Telehealth (HOSPITAL_COMMUNITY): Payer: Self-pay | Admitting: Psychiatry

## 2023-04-12 ENCOUNTER — Other Ambulatory Visit: Payer: Self-pay

## 2023-04-12 ENCOUNTER — Encounter (HOSPITAL_COMMUNITY): Payer: Self-pay | Admitting: Psychiatry

## 2023-04-12 ENCOUNTER — Telehealth (INDEPENDENT_AMBULATORY_CARE_PROVIDER_SITE_OTHER): Payer: No Payment, Other | Admitting: Psychiatry

## 2023-04-12 DIAGNOSIS — G2401 Drug induced subacute dyskinesia: Secondary | ICD-10-CM | POA: Diagnosis not present

## 2023-04-12 DIAGNOSIS — F900 Attention-deficit hyperactivity disorder, predominantly inattentive type: Secondary | ICD-10-CM | POA: Diagnosis not present

## 2023-04-12 MED ORDER — AUSTEDO 9 MG PO TABS
18.0000 mg | ORAL_TABLET | Freq: Two times a day (BID) | ORAL | 11 refills | Status: DC
Start: 2023-04-12 — End: 2023-06-28

## 2023-04-12 MED ORDER — ATOMOXETINE HCL 80 MG PO CAPS
80.0000 mg | ORAL_CAPSULE | Freq: Every day | ORAL | 3 refills | Status: DC
Start: 2023-04-12 — End: 2023-06-28
  Filled 2023-04-12 – 2023-04-18 (×2): qty 30, 30d supply, fill #0
  Filled 2023-05-30: qty 30, 30d supply, fill #1
  Filled 2023-06-16: qty 30, 30d supply, fill #2

## 2023-04-12 MED ORDER — PRAZOSIN HCL 1 MG PO CAPS
3.0000 mg | ORAL_CAPSULE | Freq: Every day | ORAL | 3 refills | Status: DC
  Filled 2023-04-12 – 2023-04-18 (×2): qty 90, 30d supply, fill #0
  Filled 2023-05-30: qty 90, 30d supply, fill #1
  Filled 2023-06-16: qty 90, 30d supply, fill #2

## 2023-04-12 NOTE — Progress Notes (Signed)
BH MD/PA/NP OP Progress Note Virtual Visit via Video Note  I connected with Fernando Garrison on 04/12/23 at  4:00 PM EDT by a video enabled telemedicine application and verified that I am speaking with the correct person using two identifiers.  Location: Patient: car Provider: Clinic   I discussed the limitations of evaluation and management by telemedicine and the availability of in person appointments. The patient expressed understanding and agreed to proceed.  I provided 30 minutes of non-face-to-face time during this encounter.       04/12/2023 10:31 AM Fernando Garrison  MRN:  [id number]  Chief Complaint:"I feel instant relief since starting the Austedo"    HPI: 57 year old male seen today for follow-up psychiatric evaluation. Patient has a history of OCD, ADD, bipolar 1, tardive dyskinesia, antisocial personality disorder, substance use (sober 3 years), and PTSD and is currently managed on Straterra 80mg  daily (receives via patient care assistance), Austedo 18mg  twice daily  (receives via patient care assistance),and Prazosin 3mg  at bedtime.  Patient notes that he medications are effective in managing his psychiatric conditions.   Today he was well-groomed, visit, cooperative, and engaged in conversation.  He informed Clinical research associate that he feels that instant relief since restarting Austedo yesterday.  Patient was given a sample of Austedo as his new prescription continues to be processing under patient care assistance.  Patient informed writer that he received a call from patient care assistance yesterday and was informed that his medication should be delivered to him by the end of the week.  Patient came into the clinic yesterday to receive the sample Of Austedo.  During visit patient had excessive eye blinking.  He also informed Clinical research associate that he had abnormal rolling of his shoulders and abnormal muscle movements in his face (provider did not witness shoulder movements or abnormal facial movements).  An  aims assessment was conducted and patient scored a 5. Patient informed Clinical research associate that his friends did not notice his excessive movements as much as he did.  He does however report that since restarting Austedo things are gradually getting better.   Patient notes that his TD drastically impacts his mental health.  He informed Clinical research associate that since being without his his Austedo for the last 12 days he has been more anxious and depressed.  He notes that he considered relapsing on illegal substances.  He however reports that he has the support of his friends and did not relapse.  Today provider conducted a GAD-7 and patient scored a 19, at his last visit he scored a 0.  Provider also conducted PHQ-9 the patient scored 18, at his last visit he scored a 0.  He informed Clinical research associate that his movements affected his sleep and appetite.  Today he denies SI/HI/AVH.  Patient did note that he felt paranoia when his movements were at their worst.   No medication changes made today.  Patient agreeable to continue medication as prescribed.  No other concerns at this time things are going really well.     Visit Diagnosis:     ICD-10-CM   1. Attention deficit hyperactivity disorder (ADHD), predominantly inattentive type  F90.0 atomoxetine (STRATTERA) 80 MG capsule    2. Tardive dyskinesia  G24.01 Deutetrabenazine (AUSTEDO) 9 MG TABS       Past Psychiatric History: Patient reports he has a history of ADD, bipolar disorder, PTSD, OCD, substance use, and antisocial personality disorder   Past Medical History:  Past Medical History:  Diagnosis Date   PUD (peptic ulcer disease)  Past Surgical History:  Procedure Laterality Date   CERVICAL DISCECTOMY     MIDDLE EAR SURGERY      Family Psychiatric History: Mother adopted unknown mental issues. Niece ADHD, nephew autism, nephew schizophrenia and ADHD,  Family History: No family history on file.  Social History:  Social History   Socioeconomic History   Marital  status: Single    Spouse name: Not on file   Number of children: Not on file   Years of education: Not on file   Highest education level: Not on file  Occupational History   Not on file  Tobacco Use   Smoking status: Every Day    Types: Cigarettes   Smokeless tobacco: Never  Substance and Sexual Activity   Alcohol use: Yes   Drug use: Yes    Types: Marijuana   Sexual activity: Not on file  Other Topics Concern   Not on file  Social History Narrative   Not on file   Social Determinants of Health   Financial Resource Strain: Not on file  Food Insecurity: Not on file  Transportation Needs: Not on file  Physical Activity: Not on file  Stress: Not on file  Social Connections: Not on file    Allergies: No Known Allergies  Metabolic Disorder Labs: No results found for: "HGBA1C", "MPG" No results found for: "PROLACTIN" No results found for: "CHOL", "TRIG", "HDL", "CHOLHDL", "VLDL", "LDLCALC" No results found for: "TSH"  Therapeutic Level Labs: No results found for: "LITHIUM" No results found for: "VALPROATE" No results found for: "CBMZ"  Current Medications: Current Outpatient Medications  Medication Sig Dispense Refill   amoxicillin-clavulanate (AUGMENTIN) 875-125 MG tablet Take 1 tablet by mouth every 12 (twelve) hours. 14 tablet 0   atomoxetine (STRATTERA) 80 MG capsule Take 1 capsule (80 mg total) by mouth daily. 30 capsule 3   Deutetrabenazine (AUSTEDO) 9 MG TABS Take 18 mg by mouth 2 (two) times daily. 120 tablet 11   nitroGLYCERIN (NITROSTAT) 0.4 MG SL tablet Place 0.4 mg under the tongue every 5 (five) minutes as needed for chest pain.     omeprazole (PRILOSEC) 40 MG capsule Take 1 capsule (40 mg total) by mouth daily. 30 capsule 0   prazosin (MINIPRESS) 1 MG capsule Take 3 capsules (3 mg total) by mouth at bedtime. 90 capsule 3   sucralfate (CARAFATE) 1 g tablet Take 1 tablet (1 g total) by mouth 4 (four) times daily -  with meals and at bedtime. 40 tablet 0    No current facility-administered medications for this visit.     Musculoskeletal: Strength & Muscle Tone: within normal limits and telehealth visit Gait & Station: normal, telehealth visit Patient leans: N/A  Psychiatric Specialty Exam: Review of Systems  There were no vitals taken for this visit.There is no height or weight on file to calculate BMI.  General Appearance: Well Groomed  Eye Contact:  Good  Speech:  Clear and Coherent and Normal Rate  Volume:  Normal  Mood:  Anxious and Depressed  Affect:  Appropriate and Congruent  Thought Process:  Coherent, Goal Directed and Linear  Orientation:  Full (Time, Place, and Person)  Thought Content: WDL and Logical   Suicidal Thoughts:  No  Homicidal Thoughts:  No  Memory:  Immediate;   Good Recent;   Good Remote;   Good  Judgement:  Good  Insight:  Good  Psychomotor Activity:  TD  Concentration:  Concentration: Good and Attention Span: Good  Recall:  Good  Fund of  Knowledge: Good  Language: Good  Akathisia:  No  Handed:  Right  AIMS (if indicated): Notes  Assets:  Communication Skills Desire for Improvement Financial Resources/Insurance Housing Social Support  ADL's:  Intact  Cognition: WNL  Sleep:  Fair   Screenings: AIMS    Flowsheet Row Video Visit from 04/12/2023 in [facility]  AIMS Total Score 5      GAD-7    Flowsheet Row Video Visit from 04/12/2023 in [facility] Video Visit from 01/31/2023 in [facility] Video Visit from 11/03/2022 in [facility] Video Visit from 08/10/2022 in [facility] Video Visit from 01/17/2022 in [facility]  Total GAD-7 Score 19 0 PHQ2-9    Flowsheet Row Video Visit from 04/12/2023 in [facility] Video Visit from 01/31/2023 in [facility]  Video Visit from 11/03/2022 in [facility] Video Visit from 08/10/2022 in [facility] Video Visit from 01/17/2022 in [facility]  PHQ-2 Total Score 6 0 0 0 0  PHQ-9 Total Score 18 0 0 0 0      Flowsheet Row ED from 12/16/2022 in [facility] RISK CATEGORY No Risk        Assessment and Plan: Patient endorses increased anxiety, depression, and poor sleep since being without Austedo for the last 12 days.  Yesterday Austedo was restarted and patient was given a sample of the medication.  Patient also contacted by the patient care assistance pharmacy and informed that his new prescription will be delivered to him by the end of the week.  No medication changes made today.  Patient agreeable to continue medication as prescribed.  1. Tardive dyskinesia  Continue- Deutetrabenazine (AUSTEDO) 9 MG TABS; Take 18 mg by mouth 2 (two) times daily.  Dispense: 120 tablet; Refill: 11  2. Attention deficit hyperactivity disorder (ADHD), predominantly inattentive type  Continue- atomoxetine (STRATTERA) 80 MG capsule; TAKE 1 CAPSULE (80 MG TOTAL) BY MOUTH DAILY.  Dispense: 30 capsule; Refill: 3  3. PTSD (post-traumatic stress disorder)  Continue- prazosin (MINIPRESS) 1 MG capsule; TAKE 3 CAPSULES (3 MG TOTAL) BY MOUTH AT BEDTIME.  Dispense: 90 capsule; Refill: 3     Follow-up in 3 months  Shanna Cisco, NP 04/12/2023, 10:31 AM

## 2023-04-12 NOTE — Telephone Encounter (Signed)
Patient called Clinical research associate on 04/07/2023 after hours and informed Clinical research associate that he ran out of of his Austedo.  Patient informed Clinical research associate that he was having side effects which was impacting his work life.  Patient described excessive blinking and abnormal movements of his hands.  He informed Clinical research associate that Ms. Mardee Postin assisted him with patient care assistance for the medication and was informed that the medication was ready but needed provider to send the e-prescription.  Provider fax a prescription on 04/05/2023 and e-prescribed a prescription on 04/07/2023.  On 04/10/2023 patient informed writer that he had not received his medication and continue to deal with side effects.  Provider called the pharmacy that supplies Austedo through patient care assistance.  Writer was told that the patient needed to reapply for patient care assistance.  Patient was informed of this on 04/11/2023 and told writer that this is incorrect.  Writer then called the pharmacy again with the patient and was then told that his medication were being processed.  Provider asked if the medication could be expedited and the pharmacist note that it would be.  Provider calleded with facility rep for samples of Austedo as he was not any in the clinic.  Patient given 2 samples of Austedo which should last him 3 weeks.  When patient came to pick up [facility] provider noticed abnormal blinking.  No abnormal hand, mouth, or leg movements noted.  No other concerns at this time.

## 2023-04-18 ENCOUNTER — Other Ambulatory Visit: Payer: Self-pay

## 2023-04-24 ENCOUNTER — Other Ambulatory Visit: Payer: Self-pay

## 2023-04-30 ENCOUNTER — Other Ambulatory Visit: Payer: Self-pay

## 2023-04-30 ENCOUNTER — Encounter (HOSPITAL_COMMUNITY): Payer: Self-pay

## 2023-04-30 ENCOUNTER — Ambulatory Visit (HOSPITAL_COMMUNITY)
Admission: EM | Admit: 2023-04-30 | Discharge: 2023-04-30 | Disposition: A | Discharge status: Home / Self Care | Payer: Self-pay

## 2023-04-30 DIAGNOSIS — M79642 Pain in left hand: Secondary | ICD-10-CM

## 2023-04-30 DIAGNOSIS — M79641 Pain in right hand: Secondary | ICD-10-CM

## 2023-04-30 DIAGNOSIS — M25562 Pain in left knee: Secondary | ICD-10-CM

## 2023-04-30 NOTE — ED Triage Notes (Signed)
Pt has infection to Rt middle finger. Pt has a lg Paronychia to RT middle finger for 6 days.

## 2023-04-30 NOTE — ED Provider Notes (Signed)
[facility]    CSN: [id number] Arrival date & time: 04/30/23  1256      History   Chief Complaint Chief Complaint  Patient presents with  . Knee Pain    HPI Fernando Garrison is a 57 y.o. male.   Patient presents to urgent care for evaluation of left posterior lateral knee pain that has been present for the last 2 weeks.  He states he has been experiencing this type of pain after remaining in seated position in the car while driving for many hours.  When he gets out of the car, he experiences difficulty and significant pain to the posterior knee and the posterior calf after attempting to stretch out and extend the left lower extremity at the knee joint.  He states the back of the left knee feels "tight" and it is difficult to bear weight on the left lower extremity due to pain momentarily but then improves and he is able to use the left lower extremity.  No redness, swelling, warmth, or rash to the posterior left knee.  He does have a history of arthroscopy to the left knee many years ago but denies recent trauma, falls, and injuries to the left knee.  Recently went hiking and camping in the mountains and states this may have made the knee pain worse.  He recently drove 4 hours home from the mountains as well.  No other recent long plane rides or long periods of inactivity.  Denies history of DVT and PE.  No cough, shortness of breath, fever/chills, or recent antibiotic/steroid use.  He has not attempted use of any over-the-counter medications to help with pain before coming to urgent care.  He would also like to be evaluated for paronychia to the fingers of the bilateral hands that he states he has had for "years".  He states the nailbeds get swollen and inflamed after he pulls hangnails from his nails. No recent drainage from the nails, states the areas intermittently hurt but he does not usually experience discomfort to the finger nails.     Past Medical History:  Diagnosis Date   . PUD (peptic ulcer disease)     Patient Active Problem List   Diagnosis Date Noted  . Generalized anxiety disorder 09/25/2020  . Attention deficit hyperactivity disorder (ADHD), predominantly inattentive type 08/12/2020  . Bipolar I disorder, most recent episode depressed (HCC) 08/12/2020  . PTSD (post-traumatic stress disorder) 08/12/2020  . Tardive dyskinesia 08/12/2020    Past Surgical History:  Procedure Laterality Date  . CERVICAL DISCECTOMY    . MIDDLE EAR SURGERY         Home Medications    Prior to Admission medications   Medication Sig Start Date End Date Taking? Authorizing Provider  amoxicillin-clavulanate (AUGMENTIN) 875-125 MG tablet Take 1 tablet by mouth every 12 (twelve) hours. 12/16/22  Yes Debby Freiberg, NP  atomoxetine (STRATTERA) 80 MG capsule Take 1 capsule (80 mg total) by mouth daily. 04/12/23  Yes Toy Cookey E, NP  Deutetrabenazine (AUSTEDO) 9 MG TABS Take 18 mg by mouth 2 (two) times daily. 04/12/23  Yes Toy Cookey E, NP  nitroGLYCERIN (NITROSTAT) 0.4 MG SL tablet Place 0.4 mg under the tongue every 5 (five) minutes as needed for chest pain.   Yes [provider]  omeprazole (PRILOSEC) 40 MG capsule Take 1 capsule (40 mg total) by mouth daily. 11/17/19  Yes Molpus, Alistair, MD  prazosin (MINIPRESS) 1 MG capsule Take 3 capsules (3 mg total)  by mouth at bedtime. 04/12/23  Yes Toy Cookey E, NP  sucralfate (CARAFATE) 1 g tablet Take 1 tablet (1 g total) by mouth 4 (four) times daily -  with meals and at bedtime. 11/17/19  Yes Molpus, Rollo, MD  ARIPiprazole (ABILIFY) 5 MG tablet Take 1 tablet (5 mg total) by mouth daily. 01/21/21 02/16/21  Shanna Cisco, NP  benztropine (COGENTIN) 0.5 MG tablet Take 2 tablets (1 mg total) by mouth 2 (two) times daily. 09/25/20 01/21/21  Shanna Cisco, NP    Family History History reviewed. No pertinent family history.  Social History Social History   Tobacco Use  . Smoking status:  Every Day    Types: Cigarettes  . Smokeless tobacco: Never  Substance Use Topics  . Alcohol use: Yes  . Drug use: Yes    Types: Marijuana     Allergies   Patient has no known allergies.   Review of Systems Review of Systems Per HPI  Physical Exam Triage Vital Signs ED Triage Vitals  Enc Vitals Group     BP 04/30/23 1315 (!) 144/94     Pulse Rate 04/30/23 1315 (!) 107     Resp 04/30/23 1315 18     Temp 04/30/23 1315 97.6 F (36.4 C)     Temp Source 04/30/23 1315 Oral     SpO2 04/30/23 1315 98 %     Weight --      Height --      Head Circumference --      Peak Flow --      Pain Score 04/30/23 1319 8     Pain Loc --      Pain Edu? --      Excl. in GC? --    No data found.  Updated Vital Signs BP (!) 144/94 (BP Location: Left Arm)   Pulse (!) 107   Temp 97.6 F (36.4 C) (Oral)   Resp 18   SpO2 98%   Visual Acuity Right Eye Distance:   Left Eye Distance:   Bilateral Distance:    Right Eye Near:   Left Eye Near:    Bilateral Near:     Physical Exam Vitals and nursing note reviewed.  Constitutional:      Appearance: He is not ill-appearing or toxic-appearing.  HENT:     Head: Normocephalic and atraumatic.     Right Ear: Hearing and external ear normal.     Left Ear: Hearing and external ear normal.     Nose: Nose normal.     Mouth/Throat:     Lips: Pink.  Eyes:     General: Lids are normal. Vision grossly intact. Gaze aligned appropriately.     Extraocular Movements: Extraocular movements intact.     Conjunctiva/sclera: Conjunctivae normal.  Cardiovascular:     Rate and Rhythm: Normal rate and regular rhythm.     Heart sounds: Normal heart sounds, S1 normal and S2 normal.  Pulmonary:     Effort: Pulmonary effort is normal. No respiratory distress.     Breath sounds: Normal breath sounds and air entry.  Musculoskeletal:     Cervical back: Neck supple.     Right knee: Normal.     Left knee: No swelling, bony tenderness or crepitus. Tenderness  (Tenderness to the left posterior lateral knee/upper calf with extension of the LLE) present. No medial joint line, lateral joint line or patellar tendon tenderness. Normal alignment and normal meniscus.     Comments: Tenderness of the left  knee is not reproducible to palpation. Negative Homans signs bilaterally. +2 popliteal pulse. No soft tissue swelling, erythema, rash, or warmth to the overlying skin of the left posterior knee. Strength and sensation intact to bilateral lower extremities.   See image below of hands for details regarding swelling to the proximal base of the nailbeds of multiple digits. No warmth or tenderness to palpation.   Skin:    General: Skin is warm and dry.     Capillary Refill: Capillary refill takes less than 2 seconds.     Findings: No rash.  Neurological:     General: No focal deficit present.     Mental Status: He is alert and oriented to person, place, and time. Mental status is at baseline.     Cranial Nerves: No dysarthria or facial asymmetry.  Psychiatric:        Mood and Affect: Mood normal.        Speech: Speech normal.        Behavior: Behavior normal.        Thought Content: Thought content normal.        Judgment: Judgment normal.     UC Treatments / Results  Labs (all labs ordered are listed, but only abnormal results are displayed) Labs Reviewed - No data to display  EKG   Radiology No results found.  Procedures Procedures (including critical care time)  Medications Ordered in UC Medications - No data to display  Initial Impression / Assessment and Plan / UC Course  I have reviewed the triage vital signs and the nursing notes.  Pertinent labs & imaging results that were available during my care of the patient were reviewed by me and considered in my medical decision making (see chart for details).     *** Final Clinical Impressions(s) / UC Diagnoses   Final diagnoses:  Posterior left knee pain  Pain in both hands      Discharge Instructions      Ibuprofen 600 mg every 6 hours as needed for pain.  Heat 20 minutes on 20 minutes off as needed for pain. Gentle exercises to the left knee.   Epsom salt soaks to the fingers.  Follow-up with [facility] as needed.  If you develop any new or worsening symptoms or do not improve in the next 2 to 3 days, please return.  If your symptoms are severe, please go to the emergency room.  Follow-up with your primary care provider for further evaluation and management of your symptoms as well as ongoing wellness visits.  I hope you feel better!      ED Prescriptions   None    PDMP not reviewed this encounter.

## 2023-04-30 NOTE — Discharge Instructions (Signed)
Ibuprofen 600 mg every 6 hours as needed for pain.  Heat 20 minutes on 20 minutes off as needed for pain. Gentle exercises to the left knee.   Epsom salt soaks to the fingers.  Follow-up with [facility] as needed.  If you develop any new or worsening symptoms or do not improve in the next 2 to 3 days, please return.  If your symptoms are severe, please go to the emergency room.  Follow-up with your primary care provider for further evaluation and management of your symptoms as well as ongoing wellness visits.  I hope you feel better!

## 2023-04-30 NOTE — ED Triage Notes (Signed)
Pt is here for 2-week history of knee pain. Denies any trauma or falls.

## 2023-05-01 ENCOUNTER — Other Ambulatory Visit: Payer: Self-pay

## 2023-05-30 ENCOUNTER — Other Ambulatory Visit: Payer: Self-pay

## 2023-06-16 ENCOUNTER — Other Ambulatory Visit: Payer: Self-pay

## 2023-06-16 ENCOUNTER — Other Ambulatory Visit (HOSPITAL_BASED_OUTPATIENT_CLINIC_OR_DEPARTMENT_OTHER): Payer: Self-pay

## 2023-06-23 ENCOUNTER — Other Ambulatory Visit: Payer: Self-pay

## 2023-06-28 ENCOUNTER — Telehealth (HOSPITAL_COMMUNITY): Payer: No Payment, Other | Admitting: Psychiatry

## 2023-06-28 ENCOUNTER — Other Ambulatory Visit: Payer: Self-pay

## 2023-06-28 ENCOUNTER — Encounter (HOSPITAL_COMMUNITY): Payer: Self-pay | Admitting: Psychiatry

## 2023-06-28 DIAGNOSIS — F900 Attention-deficit hyperactivity disorder, predominantly inattentive type: Secondary | ICD-10-CM | POA: Diagnosis not present

## 2023-06-28 DIAGNOSIS — G2401 Drug induced subacute dyskinesia: Secondary | ICD-10-CM

## 2023-06-28 MED ORDER — AUSTEDO 9 MG PO TABS
18.0000 mg | ORAL_TABLET | Freq: Two times a day (BID) | ORAL | 11 refills | Status: DC
Start: 2023-06-28 — End: 2023-09-12

## 2023-06-28 MED ORDER — ATOMOXETINE HCL 80 MG PO CAPS
80.0000 mg | ORAL_CAPSULE | Freq: Every day | ORAL | 3 refills | Status: DC
Start: 2023-06-28 — End: 2023-09-12
  Filled 2023-06-28: qty 30, 30d supply, fill #0
  Filled 2023-08-01: qty 30, 30d supply, fill #1
  Filled 2023-08-29: qty 30, 30d supply, fill #2

## 2023-06-28 MED ORDER — PRAZOSIN HCL 1 MG PO CAPS
3.0000 mg | ORAL_CAPSULE | Freq: Every day | ORAL | 3 refills | Status: DC
Start: 2023-06-28 — End: 2023-09-12
  Filled 2023-06-28: qty 90, 30d supply, fill #0
  Filled 2023-08-01: qty 90, 30d supply, fill #1
  Filled 2023-08-29: qty 90, 30d supply, fill #2

## 2023-06-28 NOTE — Progress Notes (Signed)
BH MD/PA/NP OP Progress Note Virtual Visit via Video Note  I connected with Fernando Garrison on 06/28/23 at  3:30 PM EDT by a video enabled telemedicine application and verified that I am speaking with the correct person using two identifiers.  Location: Patient: car Provider: Clinic   I discussed the limitations of evaluation and management by telemedicine and the availability of in person appointments. The patient expressed understanding and agreed to proceed.  I provided 30 minutes of non-face-to-face time during this encounter.       06/28/2023 4:17 PM Jonerik Sliker  MRN:  [id number]  Chief Complaint:"Work is busy and life is good"    HPI: 57 year old male seen today for follow-up psychiatric evaluation. Patient has a history of OCD, ADD, bipolar 1, tardive dyskinesia, antisocial personality disorder, substance use (sober 4 years), and PTSD and is currently managed on Straterra 80mg  daily (receives via patient care assistance), Austedo 18mg  twice daily  (receives via patient care assistance),and Prazosin 3mg  at bedtime.  Patient notes that he medications are effective in managing his psychiatric conditions.   Today he was well-groomed, visit, cooperative, and engaged in conversation.  He informed Clinical research associate that work is busy and life is good. He informed Clinical research associate that he continues to maintain his sobriety and reports that he will be sober for 4 years in September. Patient notes that his mood is stable and notes that he has minimal anxiety and depression.  Today provider conducted a GAD-7 and patient scored a 0, at his last visit he scored a 19.  Provider also conducted PHQ-9 the patient scored 0, at his last visit he scored a 18.  Today he denies SI/HI/AVH, mania, or paranoia.    No medication changes made today.  Patient agreeable to continue medication as prescribed. Patient has not had recent labs. Today provider ordered CBC, CMP, LFT, Thyroid Panel, UDS,  lipid panel,  Vit D, Vit B12, and Hgb  A1c. No other concerns at this time.     Visit Diagnosis:     ICD-10-CM   1. Attention deficit hyperactivity disorder (ADHD), predominantly inattentive type  F90.0 atomoxetine (STRATTERA) 80 MG capsule    prazosin (MINIPRESS) 1 MG capsule    CBC w/Diff/Platelet    Comprehensive Metabolic Panel (CMET)    Hepatic function panel    HgB A1c    Thyroid Panel With TSH    Lipid Profile    Vitamin D (25 hydroxy)    Vitamin B12    Urine Drug Panel 7    2. Tardive dyskinesia  G24.01 Deutetrabenazine (AUSTEDO) 9 MG TABS       Past Psychiatric History: Patient reports he has a history of ADD, bipolar disorder, PTSD, OCD, substance use, and antisocial personality disorder   Past Medical History:  Past Medical History:  Diagnosis Date   PUD (peptic ulcer disease)     Past Surgical History:  Procedure Laterality Date   CERVICAL DISCECTOMY     MIDDLE EAR SURGERY      Family Psychiatric History: Mother adopted unknown mental issues. Niece ADHD, nephew autism, nephew schizophrenia and ADHD,  Family History: History reviewed. No pertinent family history.  Social History:  Social History   Socioeconomic History   Marital status: Single    Spouse name: Not on file   Number of children: Not on file   Years of education: Not on file   Highest education level: Not on file  Occupational History   Not on file  Tobacco Use  Smoking status: Every Day    Types: Cigarettes   Smokeless tobacco: Never  Substance and Sexual Activity   Alcohol use: Yes   Drug use: Yes    Types: Marijuana   Sexual activity: Not on file  Other Topics Concern   Not on file  Social History Narrative   Not on file   Social Determinants of Health   Financial Resource Strain: Not on file  Food Insecurity: Not on file  Transportation Needs: Not on file  Physical Activity: Not on file  Stress: Not on file  Social Connections: Not on file    Allergies: No Known Allergies  Metabolic Disorder  Labs: No results found for: "HGBA1C", "MPG" No results found for: "PROLACTIN" No results found for: "CHOL", "TRIG", "HDL", "CHOLHDL", "VLDL", "LDLCALC" No results found for: "TSH"  Therapeutic Level Labs: No results found for: "LITHIUM" No results found for: "VALPROATE" No results found for: "CBMZ"  Current Medications: Current Outpatient Medications  Medication Sig Dispense Refill   amoxicillin-clavulanate (AUGMENTIN) 875-125 MG tablet Take 1 tablet by mouth every 12 (twelve) hours. 14 tablet 0   atomoxetine (STRATTERA) 80 MG capsule Take 1 capsule (80 mg total) by mouth daily. 30 capsule 3   Deutetrabenazine (AUSTEDO) 9 MG TABS Take 18 mg by mouth 2 (two) times daily. 120 tablet 11   nitroGLYCERIN (NITROSTAT) 0.4 MG SL tablet Place 0.4 mg under the tongue every 5 (five) minutes as needed for chest pain.     omeprazole (PRILOSEC) 40 MG capsule Take 1 capsule (40 mg total) by mouth daily. 30 capsule 0   prazosin (MINIPRESS) 1 MG capsule Take 3 capsules (3 mg total) by mouth at bedtime. 90 capsule 3   sucralfate (CARAFATE) 1 g tablet Take 1 tablet (1 g total) by mouth 4 (four) times daily -  with meals and at bedtime. 40 tablet 0   No current facility-administered medications for this visit.     Musculoskeletal: Strength & Muscle Tone: within normal limits and telehealth visit Gait & Station: normal, telehealth visit Patient leans: N/A  Psychiatric Specialty Exam: Review of Systems  There were no vitals taken for this visit.There is no height or weight on file to calculate BMI.  General Appearance: Well Groomed  Eye Contact:  Good  Speech:  Clear and Coherent and Normal Rate  Volume:  Normal  Mood:  Euthymic  Affect:  Appropriate and Congruent  Thought Process:  Coherent, Goal Directed and Linear  Orientation:  Full (Time, Place, and Person)  Thought Content: WDL and Logical   Suicidal Thoughts:  No  Homicidal Thoughts:  No  Memory:  Immediate;   Good Recent;    Good Remote;   Good  Judgement:  Good  Insight:  Good  Psychomotor Activity:  Normal  Concentration:  Concentration: Good and Attention Span: Good  Recall:  Good  Fund of Knowledge: Good  Language: Good  Akathisia:  No  Handed:  Right  AIMS (if indicated): Not done  Assets:  Communication Skills Desire for Improvement Financial Resources/Insurance Housing Social Support  ADL's:  Intact  Cognition: WNL  Sleep:  Fair   Screenings: AIMS    Flowsheet Row Video Visit from 04/12/2023 in [facility]  AIMS Total Score 5      GAD-7    Flowsheet Row Video Visit from 06/28/2023 in [facility] Video Visit from 04/12/2023 in [facility] Video Visit from 01/31/2023 in [facility] Video  Visit from 11/03/2022 in [facility] Video Visit from 08/10/2022 in [facility]  Total GAD-7 Score 0 19 0 1 2      PHQ2-9    Flowsheet Row Video Visit from 06/28/2023 in [facility] Video Visit from 04/12/2023 in [facility] Video Visit from 01/31/2023 in [facility] Video Visit from 11/03/2022 in [facility] Video Visit from 08/10/2022 in [facility]  PHQ-2 Total Score 0 6 0 0 0  PHQ-9 Total Score 0 18 0 0 0      Flowsheet Row ED from 04/30/2023 in [facility] ED from 12/16/2022 in [facility] RISK CATEGORY No Risk No Risk        Assessment and Plan: Patient reports that since starting a startle and not having symptoms of TD his anxiety, depression, and mood has been well-managed.  He informed Clinical research associate that he continues to be sober from illegal substances and is overall happy. No medication changes made today.  Patient agreeable to  continue medication as prescribed. Patient has not had recent labs. Today provider ordered CBC, CMP, LFT, Thyroid Panel, UDS,  lipid panel,  Vit D, Vit B12, and Hgb A1c.  1. Attention deficit hyperactivity disorder (ADHD), predominantly inattentive type  Continue- atomoxetine (STRATTERA) 80 MG capsule; Take 1 capsule (80 mg total) by mouth daily.  Dispense: 30 capsule; Refill: 3 Continue- prazosin (MINIPRESS) 1 MG capsule; Take 3 capsules (3 mg total) by mouth at bedtime.  Dispense: 90 capsule; Refill: 3 - CBC w/Diff/Platelet - Comprehensive Metabolic Panel (CMET) - Hepatic function panel - HgB A1c - Thyroid Panel With TSH - Lipid Profile - Vitamin D (25 hydroxy) - Vitamin B12 - Urine Drug Panel 7  2. Tardive dyskinesia  Continue- Deutetrabenazine (AUSTEDO) 9 MG TABS; Take 18 mg by mouth 2 (two) times daily.  Dispense: 120 tablet; Refill: 11    Follow-up in 3 months  Shanna Cisco, NP 06/28/2023, 4:17 PM

## 2023-06-30 ENCOUNTER — Other Ambulatory Visit: Payer: Self-pay

## 2023-08-01 ENCOUNTER — Other Ambulatory Visit: Payer: Self-pay

## 2023-08-29 ENCOUNTER — Other Ambulatory Visit: Payer: Self-pay

## 2023-09-12 ENCOUNTER — Other Ambulatory Visit: Payer: Self-pay

## 2023-09-12 ENCOUNTER — Encounter (HOSPITAL_COMMUNITY): Payer: Self-pay | Admitting: Psychiatry

## 2023-09-12 ENCOUNTER — Telehealth (INDEPENDENT_AMBULATORY_CARE_PROVIDER_SITE_OTHER): Payer: No Payment, Other | Admitting: Psychiatry

## 2023-09-12 DIAGNOSIS — G2401 Drug induced subacute dyskinesia: Secondary | ICD-10-CM | POA: Diagnosis not present

## 2023-09-12 DIAGNOSIS — F900 Attention-deficit hyperactivity disorder, predominantly inattentive type: Secondary | ICD-10-CM | POA: Diagnosis not present

## 2023-09-12 MED ORDER — ATOMOXETINE HCL 80 MG PO CAPS
80.0000 mg | ORAL_CAPSULE | Freq: Every day | ORAL | 3 refills | Status: DC
Start: 2023-09-12 — End: 2023-12-05
  Filled 2023-09-12 – 2023-09-25 (×2): qty 30, 30d supply, fill #0
  Filled 2023-10-26: qty 30, 30d supply, fill #1
  Filled 2023-11-27: qty 30, 30d supply, fill #2

## 2023-09-12 MED ORDER — AUSTEDO 9 MG PO TABS
18.0000 mg | ORAL_TABLET | Freq: Two times a day (BID) | ORAL | Status: DC
Start: 2023-09-12 — End: 2023-12-05

## 2023-09-12 MED ORDER — PRAZOSIN HCL 1 MG PO CAPS
3.0000 mg | ORAL_CAPSULE | Freq: Every day | ORAL | 3 refills | Status: DC
Start: 2023-09-12 — End: 2023-12-05
  Filled 2023-09-12 – 2023-09-25 (×2): qty 90, 30d supply, fill #0
  Filled 2023-10-26: qty 90, 30d supply, fill #1
  Filled 2023-11-27: qty 90, 30d supply, fill #2

## 2023-09-12 NOTE — Progress Notes (Signed)
BH MD/PA/NP OP Progress Note Virtual Visit via Video Note  I connected with Fernando Garrison on 09/12/23 at  3:00 PM EDT by a video enabled telemedicine application and verified that I am speaking with the correct person using two identifiers.  Location: Patient: car Provider: Clinic   I discussed the limitations of evaluation and management by telemedicine and the availability of in person appointments. The patient expressed understanding and agreed to proceed.  I provided 30 minutes of non-face-to-face time during this encounter.       09/12/2023 11:49 AM Fernando Garrison  MRN:  [id number]  Chief Complaint:"I have had a little paranoia"    HPI: 57 year old male seen today for follow-up psychiatric evaluation. Patient has a history of OCD, ADD, bipolar 1, tardive dyskinesia, antisocial personality disorder, substance use (sober 4 years), and PTSD and is currently managed on Straterra 80mg  daily (receives via patient care assistance), Austedo 18mg  twice daily  (receives via patient care assistance),and Prazosin 3mg  at bedtime.  Patient notes that he medications are effective in managing his psychiatric conditions.   Today he was well-groomed, visit, cooperative, and engaged in conversation.  He informed Clinical research associate that at time she is little paranoid.  Patient then clarified and said it is more so self-doubt.  He notes that his thoughts tell him that he cannot do things.  He however reports that he is able to cope with this by telling himself truth.  Patient notes that overall his mood is stable and notes that he has minimal anxiety and depression.  Today provider conducted a GAD-7 and patient scored a 0, at his last visit he scored a 0.  Provider also conducted PHQ-9 of he scored a 1, at his last visit he scored a 0.  He endorses adequate sleep and appetite.  Today he denies SI/HI/AVH, mania, or paranoia.    No medication changes made today.  Patient agreeable to continue medication as prescribed.  Patient has not had recent labs.   Visit Diagnosis:     ICD-10-CM   1. Attention deficit hyperactivity disorder (ADHD), predominantly inattentive type  F90.0 atomoxetine (STRATTERA) 80 MG capsule    prazosin (MINIPRESS) 1 MG capsule    2. Tardive dyskinesia  G24.01 Deutetrabenazine (AUSTEDO) 9 MG TABS        Past Psychiatric History: Patient reports he has a history of ADD, bipolar disorder, PTSD, OCD, substance use, and antisocial personality disorder   Past Medical History:  Past Medical History:  Diagnosis Date   PUD (peptic ulcer disease)     Past Surgical History:  Procedure Laterality Date   CERVICAL DISCECTOMY     MIDDLE EAR SURGERY      Family Psychiatric History: Mother adopted unknown mental issues. Niece ADHD, nephew autism, nephew schizophrenia and ADHD,  Family History: History reviewed. No pertinent family history.  Social History:  Social History   Socioeconomic History   Marital status: Single    Spouse name: Not on file   Number of children: Not on file   Years of education: Not on file   Highest education level: Not on file  Occupational History   Not on file  Tobacco Use   Smoking status: Every Day    Types: Cigarettes   Smokeless tobacco: Never  Substance and Sexual Activity   Alcohol use: Yes   Drug use: Yes    Types: Marijuana   Sexual activity: Not on file  Other Topics Concern   Not on file  Social History Narrative  Not on file   Social Determinants of Health   Financial Resource Strain: Not on file  Food Insecurity: Not on file  Transportation Needs: Not on file  Physical Activity: Not on file  Stress: Not on file  Social Connections: Unknown (05/02/2022)   [facility] from [facility]   Social Network    Social Network: Not on file    Allergies: No Known Allergies  Metabolic Disorder Labs: No results found for: "HGBA1C", "MPG" No results found for: "PROLACTIN" No results found for: "CHOL", "TRIG",  "HDL", "CHOLHDL", "VLDL", "LDLCALC" No results found for: "TSH"  Therapeutic Level Labs: No results found for: "LITHIUM" No results found for: "VALPROATE" No results found for: "CBMZ"  Current Medications: Current Outpatient Medications  Medication Sig Dispense Refill   amoxicillin-clavulanate (AUGMENTIN) 875-125 MG tablet Take 1 tablet by mouth every 12 (twelve) hours. 14 tablet 0   atomoxetine (STRATTERA) 80 MG capsule Take 1 capsule (80 mg total) by mouth daily. 30 capsule 3   Deutetrabenazine (AUSTEDO) 9 MG TABS Take 18 mg by mouth 2 (two) times daily.     nitroGLYCERIN (NITROSTAT) 0.4 MG SL tablet Place 0.4 mg under the tongue every 5 (five) minutes as needed for chest pain.     omeprazole (PRILOSEC) 40 MG capsule Take 1 capsule (40 mg total) by mouth daily. 30 capsule 0   prazosin (MINIPRESS) 1 MG capsule Take 3 capsules (3 mg total) by mouth at bedtime. 90 capsule 3   sucralfate (CARAFATE) 1 g tablet Take 1 tablet (1 g total) by mouth 4 (four) times daily -  with meals and at bedtime. 40 tablet 0   No current facility-administered medications for this visit.   He denies  Musculoskeletal: Strength & Muscle Tone: within normal limits and telehealth visit Gait & Station: normal, telehealth visit Patient leans: N/A  Psychiatric Specialty Exam: Review of Systems  There were no vitals taken for this visit.There is no height or weight on file to calculate BMI.  General Appearance: Well Groomed  Eye Contact:  Good  Speech:  Clear and Coherent and Normal Rate  Volume:  Normal  Mood:  Euthymic  Affect:  Appropriate and Congruent  Thought Process:  Coherent, Goal Directed and Linear  Orientation:  Full (Time, Place, and Person)  Thought Content: WDL and Logical   Suicidal Thoughts:  No  Homicidal Thoughts:  No  Memory:  Immediate;   Good Recent;   Good Remote;   Good  Judgement:  Good  Insight:  Good  Psychomotor Activity:  Normal  Concentration:  Concentration: Good and  Attention Span: Good  Recall:  Good  Fund of Knowledge: Good  Language: Good  Akathisia:  No  Handed:  Right  AIMS (if indicated): Not done  Assets:  Communication Skills Desire for Improvement Financial Resources/Insurance Housing Social Support  ADL's:  Intact  Cognition: WNL  Sleep:  Good   Screenings: AIMS    Flowsheet Row Video Visit from 04/12/2023 in [facility]  AIMS Total Score 5      GAD-7    Flowsheet Row Video Visit from 09/12/2023 in [facility] Video Visit from 06/28/2023 in [facility] Video Visit from 04/12/2023 in [facility] Video Visit from 01/31/2023 in [facility] Video Visit from 11/03/2022 in [facility]  Total GAD-7 Score 0 0 19 0 1      PHQ2-9  Flowsheet Row Video Visit from 09/12/2023 in [facility] Video Visit from 06/28/2023 in [facility] Video Visit from 04/12/2023 in [facility] Video Visit from 01/31/2023 in [facility] Video Visit from 11/03/2022 in [facility]  PHQ-2 Total Score 0 0 6 0 0  PHQ-9 Total Score 1 0 18 0 0      Flowsheet Row ED from 04/30/2023 in [facility] ED from 12/16/2022 in [facility] RISK CATEGORY No Risk No Risk        Assessment and Plan: Patient reports that he is doing well on his current medication regimen.  He does note that he have some self-doubt but is able to cope with it.  No medication changes made today.  Patient agreeable to continue medications as prescribed.  1. Attention deficit hyperactivity disorder (ADHD), predominantly inattentive type  Continue- atomoxetine (STRATTERA) 80 MG capsule; Take 1 capsule (80 mg total) by mouth daily.   Dispense: 30 capsule; Refill: 3 Continue- prazosin (MINIPRESS) 1 MG capsule; Take 3 capsules (3 mg total) by mouth at bedtime.  Dispense: 90 capsule; Refill: 3  2. Tardive dyskinesia  Continue- Deutetrabenazine (AUSTEDO) 9 MG TABS; Take 18 mg by mouth 2 (two) times daily.    Follow-up in 3 months  Shanna Cisco, NP 09/12/2023, 11:49 AM

## 2023-09-25 ENCOUNTER — Other Ambulatory Visit: Payer: Self-pay

## 2023-09-27 ENCOUNTER — Telehealth (HOSPITAL_COMMUNITY): Payer: No Payment, Other | Admitting: Psychiatry

## 2023-10-20 ENCOUNTER — Encounter (HOSPITAL_COMMUNITY): Payer: Self-pay

## 2023-10-20 ENCOUNTER — Emergency Department (HOSPITAL_COMMUNITY): Admission: EM | Admit: 2023-10-20 | Discharge: 2023-10-20 | Discharge status: Against Medical Advice | Payer: Self-pay

## 2023-10-20 ENCOUNTER — Ambulatory Visit (HOSPITAL_COMMUNITY)
Admission: EM | Admit: 2023-10-20 | Discharge: 2023-10-20 | Disposition: A | Discharge status: Home / Self Care | Payer: Self-pay | Attending: Emergency Medicine | Admitting: Emergency Medicine

## 2023-10-20 ENCOUNTER — Other Ambulatory Visit: Payer: Self-pay

## 2023-10-20 DIAGNOSIS — K047 Periapical abscess without sinus: Secondary | ICD-10-CM

## 2023-10-20 DIAGNOSIS — K029 Dental caries, unspecified: Secondary | ICD-10-CM

## 2023-10-20 MED ORDER — AMOXICILLIN-POT CLAVULANATE 875-125 MG PO TABS
1.0000 | ORAL_TABLET | Freq: Two times a day (BID) | ORAL | 0 refills | Status: DC
  Filled 2023-10-20: qty 14, 7d supply, fill #0

## 2023-10-20 NOTE — ED Notes (Signed)
Pt called 3x with no answer  

## 2023-10-20 NOTE — ED Triage Notes (Signed)
Patient here today with c/o left lower dental pain and swelling X 3 days. Patient has been taking Tylenol and IBU with no relief.

## 2023-10-20 NOTE — Discharge Instructions (Addendum)
Take all antibiotics as prescribed and until finished, you can take them with food to prevent gastrointestinal upset.  For pain and inflammation you can alternate between 800 mg of ibuprofen and 500 mg of Tylenol every 4-6 hours.  You can ice the area to help with the swelling.  It is important that you follow-up with a dentist, as without treatment this infection will keep reoccurring.  Do not hesitate to return to clinic if no improvement on antibiotics over the next 3 days, or if swelling and pain remain despite finishing antibiotics.  Seek immediate care if you develop any worsening of oral swelling, trouble breathing, or new concerning symptoms.  [facility] [place] Address: [address] [place], [place], [place] [address] Phone: [phone]  [facility] [phone] extension [phone] [address]  Dr. Lawrence Marseilles [phone] [address]  [place] [phone] [address] [facility] [place].  [facility]cue mission [phone] extension [phone] [place], [place], [address] First come first serve for the first 10 clients.  May do simple extractions only, no wisdom teeth or surgery.  You may try the second for Thursday of the month starting at 6:30 AM.  [employer] You may call the school to see if they are still helping to provide dental care for emergent cases.

## 2023-10-20 NOTE — ED Provider Notes (Signed)
[facility]    CSN: [id number] Arrival date & time: 10/20/23  0800      History   Chief Complaint Chief Complaint  Patient presents with   Dental Pain    HPI Fernando Garrison is a 57 y.o. male.   Patient presents to clinic with complaints of dental pain and left lower jaw swelling.  Dental pain has been ongoing but the swelling started last night and worsened into this morning.  Reports he has had a broken tooth to his left sided lower jaw for 'a while.'   Denies any fevers. Has been alternating Tylenol and ibuprofen for pain.   He was seen for this issue months ago, has yet to follow-up with a dentist.  The history is provided by the patient and medical records.  Dental Pain   Past Medical History:  Diagnosis Date   PUD (peptic ulcer disease)     Patient Active Problem List   Diagnosis Date Noted   Generalized anxiety disorder 09/25/2020   Attention deficit hyperactivity disorder (ADHD), predominantly inattentive type 08/12/2020   Bipolar I disorder, most recent episode depressed (HCC) 08/12/2020   PTSD (post-traumatic stress disorder) 08/12/2020   Tardive dyskinesia 08/12/2020    Past Surgical History:  Procedure Laterality Date   CERVICAL DISCECTOMY     MIDDLE EAR SURGERY         Home Medications    Prior to Admission medications   Medication Sig Start Date End Date Taking? Authorizing Provider  amoxicillin-clavulanate (AUGMENTIN) 875-125 MG tablet Take 1 tablet by mouth every 12 (twelve) hours. 10/20/23  Yes Rinaldo Ratel, [place], FNP  atomoxetine (STRATTERA) 80 MG capsule Take 1 capsule (80 mg total) by mouth daily. 09/12/23   Shanna Cisco, NP  Deutetrabenazine (AUSTEDO) 9 MG TABS Take 18 mg by mouth 2 (two) times daily. 09/12/23   Shanna Cisco, NP  nitroGLYCERIN (NITROSTAT) 0.4 MG SL tablet Place 0.4 mg under the tongue every 5 (five) minutes as needed for chest pain.    [provider]  prazosin (MINIPRESS) 1 MG capsule Take 3  capsules (3 mg total) by mouth at bedtime. 09/12/23   Shanna Cisco, NP  ARIPiprazole (ABILIFY) 5 MG tablet Take 1 tablet (5 mg total) by mouth daily. 01/21/21 02/16/21  Shanna Cisco, NP  benztropine (COGENTIN) 0.5 MG tablet Take 2 tablets (1 mg total) by mouth 2 (two) times daily. 09/25/20 01/21/21  Shanna Cisco, NP    Family History History reviewed. No pertinent family history.  Social History Social History   Tobacco Use   Smoking status: Former    Types: Cigarettes   Smokeless tobacco: Never  Vaping Use   Vaping status: Every Day  Substance Use Topics   Alcohol use: Not Currently   Drug use: Not Currently    Types: Marijuana     Allergies   Patient has no known allergies.   Review of Systems Review of Systems  Per HPI  Physical Exam Triage Vital Signs ED Triage Vitals [10/20/23 0821]  Encounter Vitals Group     BP 124/82     Systolic BP Percentile      Diastolic BP Percentile      Pulse Rate 75     Resp 16     Temp 98.2 F (36.8 C)     Temp Source Oral     SpO2 94 %     Weight 242 lb (109.8 kg)     Height 5' 9.5" (1.765 m)  Head Circumference      Peak Flow      Pain Score 6     Pain Loc      Pain Education      Exclude from Growth Chart    No data found.  Updated Vital Signs BP 124/82 (BP Location: Right Arm)   Pulse 75   Temp 98.2 F (36.8 C) (Oral)   Resp 16   Ht 5' 9.5" (1.765 m)   Wt 242 lb (109.8 kg)   SpO2 94%   BMI 35.22 kg/m   Visual Acuity Right Eye Distance:   Left Eye Distance:   Bilateral Distance:    Right Eye Near:   Left Eye Near:    Bilateral Near:     Physical Exam Vitals and nursing note reviewed.  Constitutional:      Appearance: Normal appearance.  HENT:     Head: Normocephalic and atraumatic.      Right Ear: External ear normal.     Left Ear: External ear normal.     Nose: Nose normal.     Mouth/Throat:     Mouth: Mucous membranes are moist.     Dentition: Abnormal dentition. Dental  tenderness, dental caries and dental abscesses present.      Comments: Significant dental erosion of the left lower teeth.  Diffuse dental tenderness extending from anterior gumline to the left jaw with a hardened area, suspect abscess.  No obvious drainage. Eyes:     Conjunctiva/sclera: Conjunctivae normal.  Cardiovascular:     Rate and Rhythm: Normal rate.  Pulmonary:     Effort: Pulmonary effort is normal.  Musculoskeletal:        General: Normal range of motion.  Neurological:     General: No focal deficit present.     Mental Status: He is alert.  Psychiatric:        Mood and Affect: Mood normal.      UC Treatments / Results  Labs (all labs ordered are listed, but only abnormal results are displayed) Labs Reviewed - No data to display  EKG   Radiology No results found.  Procedures Procedures (including critical care time)  Medications Ordered in UC Medications - No data to display  Initial Impression / Assessment and Plan / UC Course  I have reviewed the triage vital signs and the nursing notes.  Pertinent labs & imaging results that were available during my care of the patient were reviewed by me and considered in my medical decision making (see chart for details).  Vitals and triage reviewed, patient is hemodynamically stable.  Significant left lower jaw swelling from presumed dental infection, overall poor dentition with dental erosion and dental caries.  No drainable abscess at this time.  Jaw area is indurated.  Will send in Augmentin for dental infection and provided with dental resources.  Strict follow-up precautions given if no improvement with antibiotics.  Pain management discussed.  Patient verbalized understanding of treatment plan, no questions at this time.     Final Clinical Impressions(s) / UC Diagnoses   Final diagnoses:  Dental abscess  Infected dental caries     Discharge Instructions      Take all antibiotics as prescribed and until  finished, you can take them with food to prevent gastrointestinal upset.  For pain and inflammation you can alternate between 800 mg of ibuprofen and 500 mg of Tylenol every 4-6 hours.  You can ice the area to help with the swelling.  It is important  that you follow-up with a dentist, as without treatment this infection will keep reoccurring.  Do not hesitate to return to clinic if no improvement on antibiotics over the next 3 days, or if swelling and pain remain despite finishing antibiotics.  Seek immediate care if you develop any worsening of oral swelling, trouble breathing, or new concerning symptoms.  [facility] [place] Address: [address] [place], [place], [place] [address] Phone: [phone]  [facility] [phone] extension [phone] [address]  Dr. Lawrence Marseilles [phone] [address]  [place] [phone] [address] [facility].  [facility] [phone] extension [phone] [place], [place], [address] First come first serve for the first 10 clients.  May do simple extractions only, no wisdom teeth or surgery.  You may try the second for Thursday of the month starting at 6:30 AM.  [facility] You may call the school to see if they are still helping to provide dental care for emergent cases.      ED Prescriptions     Medication Sig Dispense Auth. Provider   amoxicillin-clavulanate (AUGMENTIN) 875-125 MG tablet Take 1 tablet by mouth every 12 (twelve) hours. 14 tablet Jasiya Markie, [place], [place]      PDMP not reviewed this encounter.   Rinaldo Ratel [place], [place] 10/20/23 [phone]

## 2023-10-26 ENCOUNTER — Other Ambulatory Visit: Payer: Self-pay

## 2023-10-26 MED ORDER — AMOXICILLIN-POT CLAVULANATE 875-125 MG PO TABS
1.0000 | ORAL_TABLET | Freq: Two times a day (BID) | ORAL | 0 refills | Status: AC
  Filled 2023-10-26: qty 20, 10d supply, fill #0

## 2023-10-30 ENCOUNTER — Other Ambulatory Visit: Payer: Self-pay

## 2023-11-09 ENCOUNTER — Other Ambulatory Visit: Payer: Self-pay

## 2023-11-13 ENCOUNTER — Other Ambulatory Visit: Payer: Self-pay

## 2023-11-13 MED ORDER — CLINDAMYCIN HCL 300 MG PO CAPS
300.0000 mg | ORAL_CAPSULE | Freq: Three times a day (TID) | ORAL | 0 refills | Status: DC
  Filled 2023-11-13: qty 30, 10d supply, fill #0

## 2023-11-22 ENCOUNTER — Other Ambulatory Visit: Payer: Self-pay

## 2023-11-27 ENCOUNTER — Other Ambulatory Visit: Payer: Self-pay

## 2023-12-04 ENCOUNTER — Other Ambulatory Visit: Payer: Self-pay

## 2023-12-05 ENCOUNTER — Telehealth (HOSPITAL_COMMUNITY): Payer: No Payment, Other | Admitting: Psychiatry

## 2023-12-05 ENCOUNTER — Encounter (HOSPITAL_COMMUNITY): Payer: Self-pay | Admitting: Psychiatry

## 2023-12-05 ENCOUNTER — Other Ambulatory Visit: Payer: Self-pay

## 2023-12-05 DIAGNOSIS — G2401 Drug induced subacute dyskinesia: Secondary | ICD-10-CM | POA: Diagnosis not present

## 2023-12-05 DIAGNOSIS — F900 Attention-deficit hyperactivity disorder, predominantly inattentive type: Secondary | ICD-10-CM

## 2023-12-05 MED ORDER — AUSTEDO 9 MG PO TABS: 18.0000 mg | ORAL_TABLET | Freq: Two times a day (BID) | ORAL | Status: DC

## 2023-12-05 MED ORDER — ATOMOXETINE HCL 80 MG PO CAPS
80.0000 mg | ORAL_CAPSULE | Freq: Every day | ORAL | 3 refills | Status: DC
  Filled 2023-12-05 – 2023-12-27 (×2): qty 30, 30d supply, fill #0
  Filled 2024-01-23: qty 30, 30d supply, fill #1

## 2023-12-05 MED ORDER — PRAZOSIN HCL 1 MG PO CAPS
3.0000 mg | ORAL_CAPSULE | Freq: Every day | ORAL | 3 refills | Status: DC
  Filled 2023-12-05 – 2023-12-27 (×2): qty 90, 30d supply, fill #0
  Filled 2024-01-23: qty 90, 30d supply, fill #1

## 2023-12-05 NOTE — Progress Notes (Signed)
BH MD/PA/NP OP Progress Note Virtual Visit via Video Note  I connected with Fernando Garrison on 12/05/23 at  3:00 PM EST by a video enabled telemedicine application and verified that I am speaking with the correct person using two identifiers.  Location: Patient: car Provider: Clinic   I discussed the limitations of evaluation and management by telemedicine and the availability of in person appointments. The patient expressed understanding and agreed to proceed.  I provided 30 minutes of non-face-to-face time during this encounter.       12/05/2023 9:31 AM Fernando Garrison  MRN:  [id number]  Chief Complaint:"Everything is perfect"    HPI: 57 year old male seen today for follow-up psychiatric evaluation. Patient has a history of OCD, ADD, bipolar 1, tardive dyskinesia, antisocial personality disorder, substance use (sober 4 years), and PTSD and is currently managed on Straterra 80mg  daily (receives via patient care assistance), Austedo 18mg  twice daily  (receives via patient care assistance),and Prazosin 3mg  at bedtime.  Patient notes that he medications are effective in managing his psychiatric conditions.   Today he was well-groomed, visit, cooperative, and engaged in conversation.  He informed Clinical research associate that everything is perfect.  He notes that he is enjoying the holiday season.  Patient informed Clinical research associate that he and his [place] food to the homeless for Thanksgiving and is planning to do the same thing for Christmas.  He also notes that he spent some time with his mother who has difficulties with the holiday.  Overall he notes his mood is stable and reports that he has no anxiety or depression.   Today provider conducted a GAD-7 and patient scored a 0, at his last visit he scored a 0.  Provider also conducted PHQ-9 of he scored a 0, at his last visit he scored a 0.  He endorses adequate sleep and appetite.  Today he denies SI/HI/AVH, mania, or paranoia.    No medication changes made  today.  Patient agreeable to continue medication as prescribed. Patient has not had recent labs.   Visit Diagnosis:     ICD-10-CM   1. Attention deficit hyperactivity disorder (ADHD), predominantly inattentive type  F90.0 atomoxetine (STRATTERA) 80 MG capsule    prazosin (MINIPRESS) 1 MG capsule    2. Tardive dyskinesia  G24.01 Deutetrabenazine (AUSTEDO) 9 MG TABS         Past Psychiatric History: Patient reports he has a history of ADD, bipolar disorder, PTSD, OCD, substance use, and antisocial personality disorder   Past Medical History:  Past Medical History:  Diagnosis Date   PUD (peptic ulcer disease)     Past Surgical History:  Procedure Laterality Date   CERVICAL DISCECTOMY     MIDDLE EAR SURGERY      Family Psychiatric History: Mother adopted unknown mental issues. Niece ADHD, nephew autism, nephew schizophrenia and ADHD,  Family History: History reviewed. No pertinent family history.  Social History:  Social History   Socioeconomic History   Marital status: Single    Spouse name: Not on file   Number of children: Not on file   Years of education: Not on file   Highest education level: Not on file  Occupational History   Not on file  Tobacco Use   Smoking status: Former    Types: Cigarettes   Smokeless tobacco: Never  Vaping Use   Vaping status: Every Day  Substance and Sexual Activity   Alcohol use: Not Currently   Drug use: Not Currently    Types:  Marijuana   Sexual activity: Not on file  Other Topics Concern   Not on file  Social History Narrative   Not on file   Social Drivers of Health   Financial Resource Strain: Not on file  Food Insecurity: Not on file  Transportation Needs: Not on file  Physical Activity: Not on file  Stress: Not on file  Social Connections: Unknown (05/02/2022)   [facility]   Social Network    Social Network: Not on file    Allergies: No Known Allergies  Metabolic Disorder  Labs: No results found for: "HGBA1C", "MPG" No results found for: "PROLACTIN" No results found for: "CHOL", "TRIG", "HDL", "CHOLHDL", "VLDL", "LDLCALC" No results found for: "TSH"  Therapeutic Level Labs: No results found for: "LITHIUM" No results found for: "VALPROATE" No results found for: "CBMZ"  Current Medications: Current Outpatient Medications  Medication Sig Dispense Refill   atomoxetine (STRATTERA) 80 MG capsule Take 1 capsule (80 mg total) by mouth daily. 30 capsule 3   clindamycin (CLEOCIN) 300 MG capsule Take 1 capsule (300 mg total) by mouth 3 (three) times daily. 30 capsule 0   Deutetrabenazine (AUSTEDO) 9 MG TABS Take 18 mg by mouth 2 (two) times daily.     nitroGLYCERIN (NITROSTAT) 0.4 MG SL tablet Place 0.4 mg under the tongue every 5 (five) minutes as needed for chest pain.     prazosin (MINIPRESS) 1 MG capsule Take 3 capsules (3 mg total) by mouth at bedtime. 90 capsule 3   No current facility-administered medications for this visit.   He denies  Musculoskeletal: Strength & Muscle Tone: within normal limits and telehealth visit Gait & Station: normal, telehealth visit Patient leans: N/A  Psychiatric Specialty Exam: Review of Systems  There were no vitals taken for this visit.There is no height or weight on file to calculate BMI.  General Appearance: Well Groomed  Eye Contact:  Good  Speech:  Clear and Coherent and Normal Rate  Volume:  Normal  Mood:  Euthymic  Affect:  Appropriate and Congruent  Thought Process:  Coherent, Goal Directed and Linear  Orientation:  Full (Time, Place, and Person)  Thought Content: WDL and Logical   Suicidal Thoughts:  No  Homicidal Thoughts:  No  Memory:  Immediate;   Good Recent;   Good Remote;   Good  Judgement:  Good  Insight:  Good  Psychomotor Activity:  Normal  Concentration:  Concentration: Good and Attention Span: Good  Recall:  Good  Fund of Knowledge: Good  Language: Good  Akathisia:  No  Handed:  Right   AIMS (if indicated): Not done  Assets:  Communication Skills Desire for Improvement Financial Resources/Insurance Housing Social Support  ADL's:  Intact  Cognition: WNL  Sleep:  Good   Screenings: AIMS    Flowsheet Row Video Visit from 04/12/2023 in [facility]  AIMS Total Score 5      GAD-7    Flowsheet Row Video Visit from 12/05/2023 in [facility] Video Visit from 09/12/2023 in [facility] Video Visit from 06/28/2023 in [facility] Video Visit from 04/12/2023 in [facility] Video Visit from 01/31/2023 in [facility]  Total GAD-7 Score 0 0 0 19 0      PHQ2-9    Flowsheet Row Video Visit from 12/05/2023 in [facility] Video Visit from 09/12/2023 in [facility]  Video Visit from 06/28/2023 in [facility] Video Visit from 04/12/2023 in [facility] Video Visit from 01/31/2023 in [facility]  PHQ-2 Total Score 0 0 0 6 0  PHQ-9 Total Score -- 1 0 18 0      Flowsheet Row ED from 10/20/2023 in [facility] ED from 04/30/2023 in [facility] ED from 12/16/2022 in [facility] RISK CATEGORY No Risk No Risk No Risk        Assessment and Plan: Patient reports that he is doing well on his current medication regimen.  He does note that he have some self-doubt but is able to cope with it.  No medication changes made today.  Patient agreeable to continue medications as prescribed.  1. Attention deficit hyperactivity disorder (ADHD), predominantly inattentive type  Continue- atomoxetine (STRATTERA) 80 MG capsule; Take 1 capsule (80 mg total) by mouth daily.  Dispense: 30 capsule; Refill: 3 Continue-  prazosin (MINIPRESS) 1 MG capsule; Take 3 capsules (3 mg total) by mouth at bedtime.  Dispense: 90 capsule; Refill: 3  2. Tardive dyskinesia  Continue- Deutetrabenazine (AUSTEDO) 9 MG TABS; Take 18 mg by mouth 2 (two) times daily.    Follow-up in 2 months  Shanna Cisco, NP 12/05/2023, 9:31 AM

## 2023-12-27 ENCOUNTER — Other Ambulatory Visit: Payer: Self-pay

## 2024-01-07 ENCOUNTER — Other Ambulatory Visit: Payer: Self-pay

## 2024-01-07 ENCOUNTER — Emergency Department (HOSPITAL_COMMUNITY)
Admission: EM | Admit: 2024-01-07 | Discharge: 2024-01-07 | Disposition: A | Discharge status: Home / Self Care | Payer: BLUE CROSS/BLUE SHIELD | Attending: Emergency Medicine | Admitting: Emergency Medicine

## 2024-01-07 ENCOUNTER — Encounter (HOSPITAL_COMMUNITY): Payer: Self-pay

## 2024-01-07 ENCOUNTER — Emergency Department (HOSPITAL_COMMUNITY): Payer: BLUE CROSS/BLUE SHIELD

## 2024-01-07 DIAGNOSIS — D72829 Elevated white blood cell count, unspecified: Secondary | ICD-10-CM | POA: Diagnosis not present

## 2024-01-07 DIAGNOSIS — R1013 Epigastric pain: Secondary | ICD-10-CM | POA: Insufficient documentation

## 2024-01-07 DIAGNOSIS — R112 Nausea with vomiting, unspecified: Secondary | ICD-10-CM | POA: Diagnosis not present

## 2024-01-07 LAB — CBC WITH DIFFERENTIAL/PLATELET
Abs Immature Granulocytes: 0.08 10*3/uL — ABNORMAL HIGH (ref 0.00–0.07)
Basophils Absolute: 0.1 10*3/uL (ref 0.0–0.1)
Basophils Relative: 1 %
Eosinophils Absolute: 0.3 10*3/uL (ref 0.0–0.5)
Eosinophils Relative: 2 %
HCT: 48.1 % (ref 39.0–52.0)
Hemoglobin: 15.9 g/dL (ref 13.0–17.0)
Immature Granulocytes: 1 %
Lymphocytes Relative: 19 %
Lymphs Abs: 2.5 10*3/uL (ref 0.7–4.0)
MCH: 28.6 pg (ref 26.0–34.0)
MCHC: 33.1 g/dL (ref 30.0–36.0)
MCV: 86.5 fL (ref 80.0–100.0)
Monocytes Absolute: 1.1 10*3/uL — ABNORMAL HIGH (ref 0.1–1.0)
Monocytes Relative: 9 %
Neutro Abs: 9.3 10*3/uL — ABNORMAL HIGH (ref 1.7–7.7)
Neutrophils Relative %: 68 %
Platelets: 256 10*3/uL (ref 150–400)
RBC: 5.56 MIL/uL (ref 4.22–5.81)
RDW: 12.2 % (ref 11.5–15.5)
WBC: 13.3 10*3/uL — ABNORMAL HIGH (ref 4.0–10.5)
nRBC: 0 % (ref 0.0–0.2)

## 2024-01-07 LAB — COMPREHENSIVE METABOLIC PANEL
ALT: 20 U/L (ref 0–44)
AST: 26 U/L (ref 15–41)
Albumin: 4.5 g/dL (ref 3.5–5.0)
Alkaline Phosphatase: 68 U/L (ref 38–126)
Anion gap: 10 (ref 5–15)
BUN: 15 mg/dL (ref 6–20)
CO2: 24 mmol/L (ref 22–32)
Calcium: 9 mg/dL (ref 8.9–10.3)
Chloride: 100 mmol/L (ref 98–111)
Creatinine, Ser: 0.96 mg/dL (ref 0.61–1.24)
GFR, Estimated: >60 mL/min (ref >60)
Glucose, Bld: 131 mg/dL — ABNORMAL HIGH (ref 70–99)
Potassium: 4.1 mmol/L (ref 3.5–5.1)
Sodium: 134 mmol/L — ABNORMAL LOW (ref 135–145)
Total Bilirubin: 1.1 mg/dL (ref 0.0–1.2)
Total Protein: 7.9 g/dL (ref 6.5–8.1)

## 2024-01-07 LAB — LIPASE, BLOOD: Lipase: 33 U/L (ref 11–51)

## 2024-01-07 MED ORDER — PANTOPRAZOLE SODIUM 40 MG IV SOLR
80.0000 mg | Freq: Once | INTRAVENOUS | Status: AC
  Administered 2024-01-07: 80 mg via INTRAVENOUS
  Filled 2024-01-07: qty 20

## 2024-01-07 MED ORDER — PANTOPRAZOLE SODIUM 40 MG PO TBEC
40.0000 mg | DELAYED_RELEASE_TABLET | Freq: Every day | ORAL | 0 refills | Status: DC
  Filled 2024-01-07: qty 30, 30d supply, fill #0

## 2024-01-07 MED ORDER — IOHEXOL 300 MG/ML  SOLN
100.0000 mL | Freq: Once | INTRAMUSCULAR | Status: AC | PRN
  Administered 2024-01-07: 100 mL via INTRAVENOUS

## 2024-01-07 MED ORDER — ONDANSETRON 4 MG PO TBDP
4.0000 mg | ORAL_TABLET | Freq: Three times a day (TID) | ORAL | 0 refills | Status: DC | PRN
  Filled 2024-01-07: qty 20, 7d supply, fill #0

## 2024-01-07 MED ORDER — ONDANSETRON HCL 4 MG/2ML IJ SOLN
4.0000 mg | Freq: Once | INTRAMUSCULAR | Status: AC
  Administered 2024-01-07: 4 mg via INTRAVENOUS
  Filled 2024-01-07: qty 2

## 2024-01-07 MED ORDER — SODIUM CHLORIDE 0.9 % IV BOLUS
1000.0000 mL | Freq: Once | INTRAVENOUS | Status: AC
  Administered 2024-01-07: 1000 mL via INTRAVENOUS

## 2024-01-07 NOTE — ED Provider Notes (Signed)
Emergency Department Provider Note   I have reviewed the triage vital signs and the nursing notes.   HISTORY  Chief Complaint Abdominal Pain   HPI Fernando Garrison is a 58 y.o. male with past history of peptic ulcer disease presents to the emergency department with epigastric abdominal pain and vomiting.  Patient tells me that he had some distention over the past 12 hours but epigastric pain starting 1 to 2 hours prior to ED presentation.  He has had 2 episodes of nausea and vomiting without obvious hematemesis.  No fevers.  No chest pain or shortness of breath.  Notes he has a history of this in the past which typically responds well to GI cocktail.  He is not established with gastroenterology.  Past Medical History:  Diagnosis Date   PUD (peptic ulcer disease)     Review of Systems  Constitutional: No fever/chills Cardiovascular: Denies chest pain. Respiratory: Denies shortness of breath. Gastrointestinal: Positive abdominal pain. No nausea, no vomiting.  Skin: Negative for rash. Neurological: Negative for headaches.  ____________________________________________   PHYSICAL EXAM:  VITAL SIGNS: ED Triage Vitals  Encounter Vitals Group     BP 01/07/24 0350 (!) 154/98     Pulse Rate 01/07/24 0350 (!) 102     Resp 01/07/24 0350 20     Temp 01/07/24 0350 97.6 F (36.4 C)     Temp Source 01/07/24 0350 Oral     SpO2 01/07/24 0350 94 %     Weight 01/07/24 0350 243 lb (110.2 kg)     Height 01/07/24 0350 5' 9.5" (1.765 m)   Constitutional: Alert and oriented. Well appearing and in no acute distress. Eyes: Conjunctivae are normal.  Head: Atraumatic. Nose: No congestion/rhinnorhea. Mouth/Throat: Mucous membranes are moist.   Neck: No stridor.  Cardiovascular: Normal rate, regular rhythm. Good peripheral circulation. Grossly normal heart sounds.   Respiratory: Normal respiratory effort.  No retractions. Lungs CTAB. Gastrointestinal: Soft with mid-epigastric tenderness. No  peritonitis. No distention.  Musculoskeletal: No lower extremity tenderness nor edema. No gross deformities of extremities. Neurologic:  Normal speech and language. No gross focal neurologic deficits are appreciated.  Skin:  Skin is warm, dry and intact. No rash noted.  ____________________________________________   LABS (all labs ordered are listed, but only abnormal results are displayed)  Labs Reviewed  COMPREHENSIVE METABOLIC PANEL - Abnormal; Notable for the following components:      Result Value   Sodium 134 (*)    Glucose, Bld 131 (*)    All other components within normal limits  CBC WITH DIFFERENTIAL/PLATELET - Abnormal; Notable for the following components:   WBC 13.3 (*)    Neutro Abs 9.3 (*)    Monocytes Absolute 1.1 (*)    Abs Immature Granulocytes 0.08 (*)    All other components within normal limits  LIPASE, BLOOD    ____________________________________________   PROCEDURES  Procedure(s) performed:   Procedures  None ____________________________________________   INITIAL IMPRESSION / ASSESSMENT AND PLAN / ED COURSE  Pertinent labs & imaging results that were available during my care of the patient were reviewed by me and considered in my medical decision making (see chart for details).   This patient is Presenting for Evaluation of epigastric pain, which does require a range of treatment options, and is a complaint that involves a high risk of morbidity and mortality.  The Differential Diagnoses includes but is not exclusive to acute cholecystitis, intrathoracic causes for epigastric abdominal pain, gastritis, duodenitis, pancreatitis, small bowel or large  bowel obstruction, abdominal aortic aneurysm, hernia, gastritis, etc.   Critical Interventions-    Medications  sodium chloride 0.9 % bolus 1,000 mL (0 mLs Intravenous Stopped 01/07/24 0555)  ondansetron (ZOFRAN) injection 4 mg (4 mg Intravenous Given 01/07/24 0403)  pantoprazole (PROTONIX) injection  80 mg (80 mg Intravenous Given 01/07/24 0418)  iohexol (OMNIPAQUE) 300 MG/ML solution 100 mL (100 mLs Intravenous Contrast Given 01/07/24 0452)    Reassessment after intervention: symptoms improved.   Clinical Laboratory Tests Ordered, included CBC with leukocytosis to 13.3 without anemia.  No acute kidney injury.  LFTs, bilirubin, lipase normal.  Radiologic Tests Ordered, included CT abdomen/pelvis. I independently interpreted the images and agree with radiology interpretation.   Cardiac Monitor Tracing which shows NSR.    Social Determinants of Health Risk patient is a non-smoker. Denies EtOH.   Medical Decision Making: Summary:  Patient presents emergency department with abdominal pain along with nausea and vomiting.  Seems most consistent with gastritis.  Minimal tenderness on exam but will obtain CT abdomen pelvis.  Active vomiting in the ED low suspicion for ACS although considered.   Reevaluation with update and discussion with patient. ED workup is reassuring. Plan for close follow up with PCP and discharge.   Patient's presentation is most consistent with acute presentation with potential threat to life or bodily function.   Disposition: discharge  ____________________________________________  FINAL CLINICAL IMPRESSION(S) / ED DIAGNOSES  Final diagnoses:  Epigastric pain     NEW OUTPATIENT MEDICATIONS STARTED DURING THIS VISIT:  Discharge Medication List as of 01/07/2024  6:38 AM     START taking these medications   Details  ondansetron (ZOFRAN-ODT) 4 MG disintegrating tablet Take 1 tablet (4 mg total) by mouth every 8 (eight) hours as needed., Starting Sun 01/07/2024, Normal    pantoprazole (PROTONIX) 40 MG tablet Take 1 tablet (40 mg total) by mouth daily., Starting Sun 01/07/2024, Until Tue 02/06/2024, Normal        Note:  This document was prepared using Dragon voice recognition software and may include unintentional dictation errors.  Alona Bene, MD,  [facility] Emergency Medicine    Carmelo Reidel, Arlyss Repress, MD 01/11/24 [phone]

## 2024-01-07 NOTE — Discharge Instructions (Signed)

## 2024-01-07 NOTE — ED Triage Notes (Addendum)
Pt came in POV with c/o of abdominal pain that started a couple of hour ago. Pt rates 8/10. Endorses N/V with one emesis episode during triage. Pt suspects it may be a stomach ulcer due to his hx.

## 2024-01-07 NOTE — ED Notes (Signed)
Patient transported to CT 

## 2024-01-07 NOTE — ED Notes (Signed)
ED Provider at bedside. 

## 2024-01-08 ENCOUNTER — Other Ambulatory Visit: Payer: Self-pay

## 2024-01-09 ENCOUNTER — Other Ambulatory Visit: Payer: Self-pay

## 2024-01-23 ENCOUNTER — Other Ambulatory Visit: Payer: Self-pay

## 2024-01-23 MED ORDER — METHYLPREDNISOLONE 4 MG PO TBPK
ORAL_TABLET | ORAL | 0 refills | Status: AC
  Filled 2024-01-23: qty 21, 6d supply, fill #0

## 2024-01-25 ENCOUNTER — Other Ambulatory Visit: Payer: Self-pay

## 2024-02-01 ENCOUNTER — Other Ambulatory Visit: Payer: Self-pay

## 2024-02-07 ENCOUNTER — Encounter (HOSPITAL_COMMUNITY): Payer: Self-pay | Admitting: Psychiatry

## 2024-02-07 ENCOUNTER — Telehealth (INDEPENDENT_AMBULATORY_CARE_PROVIDER_SITE_OTHER): Payer: No Payment, Other | Admitting: Psychiatry

## 2024-02-07 ENCOUNTER — Other Ambulatory Visit: Payer: Self-pay

## 2024-02-07 DIAGNOSIS — G2401 Drug induced subacute dyskinesia: Secondary | ICD-10-CM

## 2024-02-07 DIAGNOSIS — F900 Attention-deficit hyperactivity disorder, predominantly inattentive type: Secondary | ICD-10-CM | POA: Diagnosis not present

## 2024-02-07 MED ORDER — AUSTEDO 9 MG PO TABS: 18.0000 mg | ORAL_TABLET | Freq: Two times a day (BID) | ORAL | Status: DC

## 2024-02-07 MED ORDER — PRAZOSIN HCL 1 MG PO CAPS
3.0000 mg | ORAL_CAPSULE | Freq: Every day | ORAL | 3 refills | Status: DC
  Filled 2024-02-07: qty 90, 30d supply, fill #0
  Filled 2024-03-11: qty 90, 30d supply, fill #1
  Filled 2024-04-08 (×2): qty 90, 30d supply, fill #2

## 2024-02-07 MED ORDER — ATOMOXETINE HCL 80 MG PO CAPS
80.0000 mg | ORAL_CAPSULE | Freq: Every day | ORAL | 3 refills | Status: DC
  Filled 2024-02-07 – 2024-02-26 (×2): qty 30, 30d supply, fill #0
  Filled 2024-03-25: qty 30, 30d supply, fill #1
  Filled 2024-04-21: qty 30, 30d supply, fill #2

## 2024-02-07 NOTE — Progress Notes (Signed)
BH MD/PA/NP OP Progress Note Virtual Visit via Video Note  I connected with Fernando Garrison on 02/07/24 at  3:00 PM EST by a video enabled telemedicine application and verified that I am speaking with the correct person using two identifiers.  Location: Patient: Home Provider: Clinic   I discussed the limitations of evaluation and management by telemedicine and the availability of in person appointments. The patient expressed understanding and agreed to proceed.  I provided 30 minutes of non-face-to-face time during this encounter.       02/07/2024 12:14 PM Fernando Garrison  MRN:  [id number]  Chief Complaint:"I was laid off"    HPI: 58 year old male seen today for follow-up psychiatric evaluation. Patient has a history of OCD, ADD, bipolar 1, tardive dyskinesia, antisocial personality disorder, substance use (sober 4 years), and PTSD and is currently managed on Straterra 80mg  daily (receives via patient care assistance), Austedo 18mg  twice daily  (receives via patient care assistance),and Prazosin 3mg  at bedtime.  Patient notes that he medications are effective in managing his psychiatric conditions.   Today he was well-groomed, visit, cooperative, and engaged in conversation.  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 He notes that he feels a little depressed about this but is able to cope. He notes that he is able to identify his depression and can deal with it. Financially he notes that things are little stressful but is trusting God and he will find a new position soon.  Today provider conducted a GAD-7 and he scored a 1, at his last visit he scored a 0.  Provider also conducted PHQ-9 he scored a 3, at his last visit he scored a 0. He endorses adequate sleep and appetite.  Today he denies SI/HI/AVH, mania, or paranoia.    Patient informed writer that he has been having some neck pain which he quantifies as 6 out of 10.  He notes that he has degenerative bone disease.  He is  followed by PCP but is worried about his insurance coverage.  Patient notes that he was having issues affording his current coverage.  Patient referred to Ms. Henriette Combs for Medicaid assistance.  No medication changes made today.  Patient agreeable to continue medication as prescribed. Patient has not had recent labs.   Visit Diagnosis:     ICD-10-CM   1. Attention deficit hyperactivity disorder (ADHD), predominantly inattentive type  F90.0 atomoxetine (STRATTERA) 80 MG capsule    prazosin (MINIPRESS) 1 MG capsule    2. Tardive dyskinesia  G24.01 Deutetrabenazine (AUSTEDO) 9 MG TABS          Past Psychiatric History: Patient reports he has a history of ADD, bipolar disorder, PTSD, OCD, substance use, and antisocial personality disorder   Past Medical History:  Past Medical History:  Diagnosis Date   PUD (peptic ulcer disease)     Past Surgical History:  Procedure Laterality Date   CERVICAL DISCECTOMY     MIDDLE EAR SURGERY      Family Psychiatric History: Mother adopted unknown mental issues. Niece ADHD, nephew autism, nephew schizophrenia and ADHD,  Family History: History reviewed. No pertinent family history.  Social History:  Social History   Socioeconomic History   Marital status: Single    Spouse name: Not on file   Number of children: Not on file   Years of education: Not on file   Highest education level: Not on file  Occupational History   Not on file  Tobacco Use  Smoking status: Former    Types: Cigarettes   Smokeless tobacco: Never  Vaping Use   Vaping status: Every Day  Substance and Sexual Activity   Alcohol use: Not Currently   Drug use: Not Currently    Types: Marijuana   Sexual activity: Not on file  Other Topics Concern   Not on file  Social History Narrative   Not on file   Social Drivers of Health   Financial Resource Strain: Not on file  Food Insecurity: Low Risk  (01/23/2024)   Received from [facility]   Hunger Vital Sign     Worried About Running Out of Food in the Last Year: Never true    Ran Out of Food in the Last Year: Never true  Transportation Needs: No Transportation Needs (01/23/2024)   Received from [facility]    In the past 12 months, has lack of reliable transportation kept you from medical appointments, meetings, work or from getting things needed for daily living? : No  Physical Activity: Not on file  Stress: Not on file  Social Connections: Unknown (05/02/2022)   Received from [facility]   Social Network    Social Network: Not on file    Allergies: No Known Allergies  Metabolic Disorder Labs: No results found for: "HGBA1C", "MPG" No results found for: "PROLACTIN" No results found for: "CHOL", "TRIG", "HDL", "CHOLHDL", "VLDL", "LDLCALC" No results found for: "TSH"  Therapeutic Level Labs: No results found for: "LITHIUM" No results found for: "VALPROATE" No results found for: "CBMZ"  Current Medications: Current Outpatient Medications  Medication Sig Dispense Refill   atomoxetine (STRATTERA) 80 MG capsule Take 1 capsule (80 mg total) by mouth daily. 30 capsule 3   clindamycin (CLEOCIN) 300 MG capsule Take 1 capsule (300 mg total) by mouth 3 (three) times daily. 30 capsule 0   Deutetrabenazine (AUSTEDO) 9 MG TABS Take 18 mg by mouth 2 (two) times daily.     ondansetron (ZOFRAN-ODT) 4 MG disintegrating tablet Take 1 tablet (4 mg total) by mouth every 8 (eight) hours as needed. 20 tablet 0   pantoprazole (PROTONIX) 40 MG tablet Take 1 tablet (40 mg total) by mouth daily. 30 tablet 0   prazosin (MINIPRESS) 1 MG capsule Take 3 capsules (3 mg total) by mouth at bedtime. 90 capsule 3   No current facility-administered medications for this visit.   He denies  Musculoskeletal: Strength & Muscle Tone: within normal limits and telehealth visit Gait & Station: normal, telehealth visit Patient leans: N/A  Psychiatric Specialty Exam: Review of Systems   There were no vitals taken for this visit.There is no height or weight on file to calculate BMI.  General Appearance: Well Groomed  Eye Contact:  Good  Speech:  Clear and Coherent and Normal Rate  Volume:  Normal  Mood:  Euthymic  Affect:  Appropriate and Congruent  Thought Process:  Coherent, Goal Directed and Linear  Orientation:  Full (Time, Place, and Person)  Thought Content: WDL and Logical   Suicidal Thoughts:  No  Homicidal Thoughts:  No  Memory:  Immediate;   Good Recent;   Good Remote;   Good  Judgement:  Good  Insight:  Good  Psychomotor Activity:  Normal  Concentration:  Concentration: Good and Attention Span: Good  Recall:  Good  Fund of Knowledge: Good  Language: Good  Akathisia:  No  Handed:  Right  AIMS (if indicated): Not done  Assets:  Communication Skills Desire for  Improvement Financial Resources/Insurance Housing Social Support  ADL's:  Intact  Cognition: WNL  Sleep:  Good   Screenings: AIMS    Flowsheet Row Video Visit from 04[facility]12/2023 in [facility]  AIMS Total Score 5      GAD-7    Flowsheet Row Video Visit from 02/07/2024 in [facility] Video Visit from 12/05/2023 in [facility] Video Visit from 09/12/2023 in [facility] Video Visit from 06/28/2023 in [facility] Video Visit from 04/12/2023 in [facility]  Total GAD-7 Score 1 0 0 0 19      PHQ2-9    Flowsheet Row Video Visit from 02/07/2024 in [facility] Video Visit from 12/05/2023 in [facility] Video Visit from 09/12/2023 in [facility] Video Visit from 06/28/2023 in [facility] Video Visit from 04/12/2023 in [facility]  PHQ-2 Total Score 2 0 0 0 6  PHQ-9 Total Score 3 -- 1 0 18       Flowsheet Row ED from 01/07/2024 in [facility] Emergency Department at [facility] ED from 10/20/2023 in [facility] Urgent Care at [facility] ED from 04/30/2023 in [facility] at [facility] RISK CATEGORY No Risk No Risk No Risk        Assessment and Plan: Patient reports that he has situational depression since being laid off.  He however reports that he is able to cope with it.   No medication changes made today.  Patient agreeable to continue medications as prescribed.  1. Attention deficit hyperactivity disorder (ADHD), predominantly inattentive type  Continue- atomoxetine (STRATTERA) 80 MG capsule; Take 1 capsule (80 mg total) by mouth daily.  Dispense: 30 capsule; Refill: 3 Continue- prazosin (MINIPRESS) 1 MG capsule; Take 3 capsules (3 mg total) by mouth at bedtime.  Dispense: 90 capsule; Refill: 3  2. Tardive dyskinesia  Continue- Deutetrabenazine (AUSTEDO) 9 MG TABS; Take 18 mg by mouth 2 (two) times daily.    Follow-up in 3 months  Shanna Cisco, NP 02/07/2024, 12:14 PM

## 2024-02-12 ENCOUNTER — Other Ambulatory Visit: Payer: Self-pay

## 2024-02-12 ENCOUNTER — Other Ambulatory Visit (HOSPITAL_BASED_OUTPATIENT_CLINIC_OR_DEPARTMENT_OTHER): Payer: Self-pay

## 2024-02-26 ENCOUNTER — Other Ambulatory Visit: Payer: Self-pay

## 2024-03-11 ENCOUNTER — Other Ambulatory Visit (HOSPITAL_BASED_OUTPATIENT_CLINIC_OR_DEPARTMENT_OTHER): Payer: Self-pay

## 2024-03-11 ENCOUNTER — Other Ambulatory Visit: Payer: Self-pay

## 2024-03-11 ENCOUNTER — Other Ambulatory Visit (HOSPITAL_COMMUNITY): Payer: Self-pay | Admitting: Psychiatry

## 2024-03-11 ENCOUNTER — Telehealth (HOSPITAL_COMMUNITY): Payer: Self-pay | Admitting: *Deleted

## 2024-03-11 DIAGNOSIS — G2401 Drug induced subacute dyskinesia: Secondary | ICD-10-CM

## 2024-03-11 MED ORDER — AUSTEDO 9 MG PO TABS: 18.0000 mg | ORAL_TABLET | Freq: Two times a day (BID) | ORAL | Status: DC

## 2024-03-11 NOTE — Telephone Encounter (Signed)
 Pt LVM at [facility] office stating that his pharmacy does not have Austedo 9 mg (18 mg total dose) tablets in stock. [facility] may have this in stock. FYI.

## 2024-03-11 NOTE — Telephone Encounter (Signed)
Medication sent to [facility] .

## 2024-03-20 ENCOUNTER — Telehealth (HOSPITAL_COMMUNITY): Payer: Self-pay | Admitting: *Deleted

## 2024-03-20 ENCOUNTER — Other Ambulatory Visit (HOSPITAL_COMMUNITY): Payer: Self-pay | Admitting: Psychiatry

## 2024-03-20 DIAGNOSIS — G2401 Drug induced subacute dyskinesia: Secondary | ICD-10-CM

## 2024-03-20 MED ORDER — AUSTEDO 9 MG PO TABS: 18.0000 mg | ORAL_TABLET | Freq: Two times a day (BID) | ORAL | 3 refills | Status: DC

## 2024-03-20 NOTE — Telephone Encounter (Signed)
 Please send new prescription for the Austedo 9 mg tabs (total dose 18 mg) to [facility]. Pharmacy has been added to pt Snapshot. Thank you.

## 2024-03-20 NOTE — Telephone Encounter (Signed)
 Medication sent to preferred pharmacy

## 2024-03-21 ENCOUNTER — Telehealth (HOSPITAL_COMMUNITY): Payer: Self-pay | Admitting: *Deleted

## 2024-03-21 NOTE — Telephone Encounter (Signed)
 Fax received from [place] that states Austedo 9mg  must be sent to a specialty pharmacy. They are unable to fill.

## 2024-03-22 ENCOUNTER — Other Ambulatory Visit (HOSPITAL_COMMUNITY): Payer: Self-pay | Admitting: Psychiatry

## 2024-03-22 DIAGNOSIS — G2401 Drug induced subacute dyskinesia: Secondary | ICD-10-CM

## 2024-03-22 MED ORDER — AUSTEDO 9 MG PO TABS: 18.0000 mg | ORAL_TABLET | Freq: Two times a day (BID) | ORAL | 3 refills | Status: DC

## 2024-03-22 NOTE — Telephone Encounter (Signed)
 Patient requests that medication be sent to the [facility] near the [facility] on [address].  Medication sent to preferred pharmacy.

## 2024-03-25 ENCOUNTER — Other Ambulatory Visit: Payer: Self-pay

## 2024-03-26 ENCOUNTER — Telehealth (HOSPITAL_COMMUNITY): Payer: Self-pay

## 2024-03-26 NOTE — Telephone Encounter (Signed)
 Pts AUSTEDO (deutetrabenazine) 9MG  tablets has beenstarted on 04/08   AUSTEDO (deutetrabenazine) 9MG  tablets approved from 03/26/2024-03/26/2025  JNL

## 2024-04-02 ENCOUNTER — Telehealth (HOSPITAL_COMMUNITY): Payer: Self-pay | Admitting: *Deleted

## 2024-04-02 NOTE — Telephone Encounter (Signed)
 Erroneous encounter

## 2024-04-08 ENCOUNTER — Other Ambulatory Visit: Payer: Self-pay

## 2024-04-09 ENCOUNTER — Other Ambulatory Visit: Payer: Self-pay

## 2024-04-22 ENCOUNTER — Other Ambulatory Visit: Payer: Self-pay

## 2024-04-26 ENCOUNTER — Other Ambulatory Visit: Payer: Self-pay

## 2024-04-26 ENCOUNTER — Other Ambulatory Visit (HOSPITAL_COMMUNITY): Payer: Self-pay

## 2024-04-26 MED ORDER — AUSTEDO 9 MG PO TABS
2.0000 | ORAL_TABLET | Freq: Two times a day (BID) | ORAL | 3 refills | Status: DC
  Filled 2024-04-26: qty 60, 30d supply, fill #0

## 2024-04-29 ENCOUNTER — Other Ambulatory Visit (HOSPITAL_COMMUNITY): Payer: Self-pay

## 2024-04-29 ENCOUNTER — Other Ambulatory Visit: Payer: Self-pay

## 2024-04-29 ENCOUNTER — Encounter (HOSPITAL_COMMUNITY): Payer: Self-pay

## 2024-04-29 ENCOUNTER — Telehealth (HOSPITAL_COMMUNITY): Payer: Self-pay

## 2024-04-29 NOTE — Progress Notes (Signed)
 Pharmacy Patient Advocate Encounter   Received notification from Patient Pharmacy that prior authorization for Austedo  is required/requested.   Insurance verification completed.   The patient is insured through [employer] [place] .   Per test claim: PA required; PA submitted to above mentioned insurance via CoverMyMeds Key/confirmation #/EOC [id number] Status is pending   Faxed key to provider. They will complete

## 2024-04-29 NOTE — Telephone Encounter (Signed)
 Medication management - Prior authorization for continued Austedo  9 mg submitted online with CoverMyMeds and sent to [facility] for a pending review and decision.

## 2024-04-30 ENCOUNTER — Telehealth (HOSPITAL_COMMUNITY): Payer: Self-pay

## 2024-04-30 ENCOUNTER — Encounter (HOSPITAL_COMMUNITY): Payer: Self-pay | Admitting: Psychiatry

## 2024-04-30 ENCOUNTER — Telehealth (HOSPITAL_COMMUNITY): Payer: Self-pay | Admitting: Psychiatry

## 2024-04-30 ENCOUNTER — Other Ambulatory Visit: Payer: Self-pay

## 2024-04-30 ENCOUNTER — Telehealth (HOSPITAL_COMMUNITY): Payer: MEDICAID | Admitting: Psychiatry

## 2024-04-30 ENCOUNTER — Other Ambulatory Visit (HOSPITAL_COMMUNITY): Payer: Self-pay

## 2024-04-30 DIAGNOSIS — F900 Attention-deficit hyperactivity disorder, predominantly inattentive type: Secondary | ICD-10-CM

## 2024-04-30 DIAGNOSIS — G2401 Drug induced subacute dyskinesia: Secondary | ICD-10-CM | POA: Diagnosis not present

## 2024-04-30 MED ORDER — AUSTEDO 9 MG PO TABS
18.0000 mg | ORAL_TABLET | Freq: Two times a day (BID) | ORAL | 3 refills | Status: DC
  Filled 2024-04-30 – 2024-05-01 (×3): qty 120, 30d supply, fill #0

## 2024-04-30 MED ORDER — PRAZOSIN HCL 1 MG PO CAPS
3.0000 mg | ORAL_CAPSULE | Freq: Every day | ORAL | 3 refills | Status: DC
  Filled 2024-04-30 – 2024-05-14 (×2): qty 90, 30d supply, fill #0

## 2024-04-30 MED ORDER — ATOMOXETINE HCL 80 MG PO CAPS
80.0000 mg | ORAL_CAPSULE | Freq: Every day | ORAL | 3 refills | Status: DC
  Filled 2024-04-30 – 2024-05-14 (×2): qty 30, 30d supply, fill #0

## 2024-04-30 NOTE — Telephone Encounter (Signed)
 PA was denied on 04/30/2024. Awaiting for them to send back papers to start appeal.   JNL

## 2024-04-30 NOTE — Progress Notes (Signed)
 BH MD/PA/NP OP Progress Note Virtual Visit via Video Note  I connected with Fernando Garrison on 04/30/24 at  4:00 PM EDT by a video enabled telemedicine application and verified that I am speaking with the correct person using two identifiers.  Location: Patient: Home Provider: Clinic   I discussed the limitations of evaluation and management by telemedicine and the availability of in person appointments. The patient expressed understanding and agreed to proceed.  I provided 30 minutes of non-face-to-face time during this encounter.       04/30/2024 2:04 PM Fernando Garrison  MRN:  [id number]  Chief Complaint:"I am anxious and agitated"    HPI: 58 year old male seen today for follow-up psychiatric evaluation. Patient has a history of OCD, ADD, bipolar 1, tardive dyskinesia, antisocial personality disorder, substance use (sober 5 years), and PTSD and is currently managed on Straterra 80mg  daily (receives via patient care assistance), Austedo  18mg  twice daily  (receives via patient care assistance),and Prazosin  3mg  at bedtime.  Patient notes that he medications are effective in managing his psychiatric conditions.  Today he was well-groomed, visit, cooperative, and engaged in conversation.  He informed Clinical research associate that today he is anxious and agitated.  Patient recently was insured and has [employer].  He notes that he prefers Medicaid as having insurance has interrupted the care he can receive.  Patient has been receiving Austedo  fr the patient care assistance for over a year.  With [employer] he may  from  qualify for this program.  Patient does have an appoint with Fernando Garrison to discuss his options.  A prior Auth was done on 04/29/2024 by Mr. Fernando Garrison to get  Austedo  approved.    Despite the above stressors patient notes that his anxiety and depression continues to be well-managed.  Today provider conducted a GAD-7 and he scored a 0, at his last visit he scored a 0.  Provider  also conducted PHQ-9 he scored a 0, at his last visit he scored a 3. He endorses adequate sleep and appetite.  Today he denies SI/HI/AVH, mania, or paranoia.    Patient informed Clinical research associate that he recently got a new job working in [employer].  He reports that he finds enjoyment in his job.  He notes that keeping busy, staying away from anger, tiredness, and hunger helps to cope.  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  Patient continues to have neck, back pain from his degenerative disc disease.  He quantifies his pain today as 3 out of 10.  He notes that ibuprofen  is helpful in managing his pain.  No medication changes made today.  Patient agreeable to continue medications as prescribed.     Visit Diagnosis:     ICD-10-CM   1. Tardive dyskinesia  G24.01 Deutetrabenazine  (AUSTEDO ) 9 MG TABS    2. Attention deficit hyperactivity disorder (ADHD), predominantly inattentive type  F90.0 prazosin  (MINIPRESS ) 1 MG capsule    atomoxetine  (STRATTERA ) 80 MG capsule           Past Psychiatric History: Patient reports he has a history of ADD, bipolar disorder, PTSD, OCD, substance use, and antisocial personality disorder   Past Medical History:  Past Medical History:  Diagnosis Date   PUD (peptic ulcer disease)     Past Surgical History:  Procedure Laterality Date   CERVICAL DISCECTOMY     MIDDLE EAR SURGERY  Family Psychiatric History: Mother adopted unknown mental issues. Niece ADHD, nephew autism, nephew schizophrenia and ADHD,  Family History: No family history on file.  Social History:  Social History   Socioeconomic History   Marital status: Single    Spouse name: Not on file   Number of children: Not on file   Years of education: Not on file   Highest education level: Not on file  Occupational History   Not on file  Tobacco Use   Smoking status: Former    Types: Cigarettes    Smokeless tobacco: Never  Vaping Use   Vaping status: Every Day  Substance and Sexual Activity   Alcohol use: Not Currently   Drug use: Not Currently    Types: Marijuana   Sexual activity: Not on file  Other Topics Concern   Not on file  Social History Narrative   Not on file   Social Drivers of Health   Financial Resource Strain: Not on file  Food Insecurity: Low Risk  (01/23/2024)   Received from [facility]   Hunger Vital Sign    Worried About Running Out of Food in the Last Year: Never true    Ran Out of Food in the Last Year: Never true  Transportation Needs: No Transportation Needs (01/23/2024)   Received from [facility]    In the past 12 months, has lack of reliable transportation kept you from medical appointments, meetings, work or from getting things needed for daily living? : No  Physical Activity: Not on file  Stress: Not on file  Social Connections: Unknown (05/02/2022)   [facility] from [facility]   Social Network    Social Network: Not on file    Allergies: No Known Allergies  Metabolic Disorder Labs: No results found for: "HGBA1C", "MPG" No results found for: "PROLACTIN" No results found for: "CHOL", "TRIG", "HDL", "CHOLHDL", "VLDL", "LDLCALC" No results found for: "TSH"  Therapeutic Level Labs: No results found for: "LITHIUM" No results found for: "VALPROATE" No results found for: "CBMZ"  Current Medications: Current Outpatient Medications  Medication Sig Dispense Refill   atomoxetine  (STRATTERA ) 80 MG capsule Take 1 capsule (80 mg total) by mouth daily. 30 capsule 3   clindamycin  (CLEOCIN ) 300 MG capsule Take 1 capsule (300 mg total) by mouth 3 (three) times daily. 30 capsule 0   Deutetrabenazine  (AUSTEDO ) 9 MG TABS Take 18 mg by mouth 2 (two) times daily. 120 tablet 3   ondansetron  (ZOFRAN -ODT) 4 MG disintegrating tablet Take 1 tablet (4 mg total) by mouth every 8 (eight) hours as needed. 20 tablet 0    pantoprazole  (PROTONIX ) 40 MG tablet Take 1 tablet (40 mg total) by mouth daily. 30 tablet 0   prazosin  (MINIPRESS ) 1 MG capsule Take 3 capsules (3 mg total) by mouth at bedtime. 90 capsule 3   No current facility-administered medications for this visit.   He denies  Musculoskeletal: Strength & Muscle Tone: within normal limits and telehealth visit Gait & Station: normal, telehealth visit Patient leans: N/A  Psychiatric Specialty Exam: Review of Systems  There were no vitals taken for this visit.There is no height or weight on file to calculate BMI.  General Appearance: Well Groomed  Eye Contact:  Good  Speech:  Clear and Coherent and Normal Rate  Volume:  Normal  Mood:  Euthymic  Affect:  Appropriate and Congruent  Thought Process:  Coherent, Goal Directed and Linear  Orientation:  Full (Time, Place, and Person)  Thought  Content: WDL and Logical   Suicidal Thoughts:  No  Homicidal Thoughts:  No  Memory:  Immediate;   Good Recent;   Good Remote;   Good  Judgement:  Good  Insight:  Good  Psychomotor Activity:  Normal  Concentration:  Concentration: Good and Attention Span: Good  Recall:  Good  Fund of Knowledge: Good  Language: Good  Akathisia:  No  Handed:  Right  AIMS (if indicated): Not done  Assets:  Communication Skills Desire for Improvement Financial Resources/Insurance Housing Social Support  ADL's:  Intact  Cognition: WNL  Sleep:  Good   Screenings: AIMS    Flowsheet Row Video Visit from 04/12/2023 in [facility]  AIMS Total Score 5      GAD-7    Flowsheet Row Video Visit from 04/30/2024 in [facility] Video Visit from 02/07/2024 in [facility] Video Visit from 12/05/2023 in [facility] Video Visit from 09/12/2023 in [facility] Video Visit from 06/28/2023 in [facility]  Total GAD-7  Score 0 1 0 0 0      PHQ2-9    Flowsheet Row Video Visit from 04/30/2024 in [facility] Video Visit from 02/07/2024 in [facility] Video Visit from 12/05/2023 in [facility] Video Visit from 09/12/2023 in [facility] Video Visit from 06/28/2023 in [facility]  PHQ-2 Total Score 0 2 0 0 0  PHQ-9 Total Score 0 3 -- 1 0      Flowsheet Row ED from 01/07/2024 in [facility] Emergency Department at [facility] UC from 10/20/2023 in [facility] at [facility] UC from 04/30/2023 in [facility]gent [facility] at [facility] RISK CATEGORY No Risk No Risk No Risk        Assessment and Plan: Patient reports that he has some anxiety related to his insurance and potential denial/approval of his medication.  He however notes that he is able to cope.  A prior authorization was completed but Mr. Fernando Garrison on 04/29/2024 to get approval of Austedo .  At this time no medication changes made.  Patient agreeable to continue medications as prescribed.    1. Attention deficit hyperactivity disorder (ADHD), predominantly inattentive type  Continue- atomoxetine  (STRATTERA ) 80 MG capsule; Take 1 capsule (80 mg total) by mouth daily.  Dispense: 30 capsule; Refill: 3 Continue- prazosin  (MINIPRESS ) 1 MG capsule; Take 3 capsules (3 mg total) by mouth at bedtime.  Dispense: 90 capsule; Refill: 3  2. Tardive dyskinesia  Continue- Deutetrabenazine  (AUSTEDO ) 9 MG TABS; Take 18 mg by mouth 2 (two) times daily.    Follow-up in 3 months  Arlyne Bering, NP 04/30/2024, 2:04 PM

## 2024-04-30 NOTE — Progress Notes (Signed)
 Pharmacy Patient Advocate Encounter  Received notification from [place] [employer] that Prior Authorization for Austedo  has been DENIED.      PA #/Case ID/Reference #:  [id number]  E-Appeal available. Shared key with provider so they can complete.

## 2024-04-30 NOTE — Telephone Encounter (Signed)
 Hello,   Pt, states that his   Auestedo needs to be sent to- [facility] hospital speciality NOT [facility] on file.    JNL

## 2024-05-01 ENCOUNTER — Other Ambulatory Visit (HOSPITAL_COMMUNITY): Payer: Self-pay

## 2024-05-01 ENCOUNTER — Telehealth (HOSPITAL_COMMUNITY): Payer: No Payment, Other | Admitting: Psychiatry

## 2024-05-01 ENCOUNTER — Other Ambulatory Visit: Payer: Self-pay

## 2024-05-01 ENCOUNTER — Other Ambulatory Visit (HOSPITAL_COMMUNITY): Payer: Self-pay | Admitting: Psychiatry

## 2024-05-01 DIAGNOSIS — G2401 Drug induced subacute dyskinesia: Secondary | ICD-10-CM

## 2024-05-01 MED ORDER — AUSTEDO 9 MG PO TABS
18.0000 mg | ORAL_TABLET | Freq: Two times a day (BID) | ORAL | 3 refills | Status: DC
  Filled 2024-05-01: qty 120, fill #0
  Filled 2024-05-02: qty 120, 30d supply, fill #0

## 2024-05-01 NOTE — Telephone Encounter (Signed)
 Medication sent to preferred pharmacy

## 2024-05-02 ENCOUNTER — Telehealth (HOSPITAL_COMMUNITY): Payer: Self-pay | Admitting: *Deleted

## 2024-05-02 ENCOUNTER — Other Ambulatory Visit (HOSPITAL_COMMUNITY): Payer: Self-pay

## 2024-05-02 ENCOUNTER — Telehealth (HOSPITAL_COMMUNITY): Payer: Self-pay | Admitting: Psychiatry

## 2024-05-02 NOTE — Telephone Encounter (Signed)
 Fax receivied for denial of Austedo  PA that was attempted for the 2nd time. States that appeal can be done by calling [phone] with LK#[id number]

## 2024-05-02 NOTE — Telephone Encounter (Signed)
 Thank you for this update

## 2024-05-02 NOTE — Telephone Encounter (Signed)
 Provider will share this with nursing staff as they submitted prior authorization

## 2024-05-02 NOTE — Telephone Encounter (Signed)
 Fax received for PA of Austedo , submitted online with cover my meds. Awaiting decision.

## 2024-05-03 ENCOUNTER — Telehealth (HOSPITAL_COMMUNITY): Payer: Self-pay

## 2024-05-03 ENCOUNTER — Other Ambulatory Visit (HOSPITAL_COMMUNITY): Payer: Self-pay

## 2024-05-03 NOTE — Telephone Encounter (Signed)
 The lady: Fernando Garrison  called back with pt on the phone, Information was not given pertaining to pts chart and told pt that we did all that could be done for PT including multiple appeals along with PA's. While trying to work with his pharmacy and insurance companies.    At this moment Pt was [place], which he was receiving injection for 4.00 copay. Now this pt bought private insurance after losing/ finding new job -[employer] which requires a large co-pay. AT this moment pt has him self in a "loop-hole" where he states that he canceled his private  insurance in hopes to regain access to medicaid. However, as this is not our expertise pt was denied PA from [employer]. We suggest that pt reaches out to both insurance companies to provide each with proper information about cancellation, as it may flag him for having both insurances. Until this is updated as noted from our DSS- Social Risk manager, states that he will be charged to his private insurance first.    JNL

## 2024-05-06 NOTE — Telephone Encounter (Signed)
 Thank you for this update

## 2024-05-14 ENCOUNTER — Other Ambulatory Visit: Payer: Self-pay

## 2024-05-14 ENCOUNTER — Other Ambulatory Visit (HOSPITAL_COMMUNITY): Payer: Self-pay | Admitting: Psychiatry

## 2024-05-15 ENCOUNTER — Other Ambulatory Visit (HOSPITAL_COMMUNITY): Payer: Self-pay

## 2024-05-15 ENCOUNTER — Other Ambulatory Visit: Payer: Self-pay

## 2024-05-15 NOTE — TOC Benefit Eligibility Note (Signed)
 Pharmacy Patient Advocate Encounter   Received notification from Inpatient Request that prior authorization for Austedo  XR 18mg  is required/requested.   Insurance verification completed.   The patient is insured through [place] .   Per test claim: PA required; PA submitted to above mentioned insurance via CoverMyMeds Key/confirmation #/EOC [id number] Status is pending   Pharmacy Patient Advocate Encounter  Received notification from [facility] that Prior Authorization for Austedo  Xr 18mg   has been DENIED.  Full denial letter will be uploaded to the media tab. See denial reason below.   PA #/Case ID/Reference #: [id number]

## 2024-05-16 ENCOUNTER — Other Ambulatory Visit (HOSPITAL_COMMUNITY): Payer: Self-pay

## 2024-05-16 ENCOUNTER — Encounter (HOSPITAL_COMMUNITY): Payer: MEDICAID | Admitting: Psychiatry

## 2024-05-16 ENCOUNTER — Telehealth (HOSPITAL_COMMUNITY): Payer: Self-pay | Admitting: Pharmacy Technician

## 2024-05-16 NOTE — Telephone Encounter (Signed)
 Pharmacy Patient Advocate Encounter  Insurance verification completed.    The patient is insured through [place].     Ran test claim for INGREZZA CAPSULES and the medication requires a prior authorization.   This test claim was processed through [facility]- copay amounts may vary at other pharmacies due to pharmacy/plan contracts, or as the patient moves through the different stages of their insurance plan.

## 2024-05-21 ENCOUNTER — Ambulatory Visit (INDEPENDENT_AMBULATORY_CARE_PROVIDER_SITE_OTHER): Payer: MEDICAID | Admitting: Psychiatry

## 2024-05-21 ENCOUNTER — Other Ambulatory Visit: Payer: Self-pay

## 2024-05-21 ENCOUNTER — Other Ambulatory Visit (HOSPITAL_BASED_OUTPATIENT_CLINIC_OR_DEPARTMENT_OTHER): Payer: Self-pay

## 2024-05-21 ENCOUNTER — Other Ambulatory Visit (HOSPITAL_COMMUNITY): Payer: Self-pay

## 2024-05-21 ENCOUNTER — Encounter (HOSPITAL_COMMUNITY): Payer: Self-pay | Admitting: Psychiatry

## 2024-05-21 DIAGNOSIS — G2401 Drug induced subacute dyskinesia: Secondary | ICD-10-CM

## 2024-05-21 DIAGNOSIS — F900 Attention-deficit hyperactivity disorder, predominantly inattentive type: Secondary | ICD-10-CM

## 2024-05-21 MED ORDER — ATOMOXETINE HCL 80 MG PO CAPS
80.0000 mg | ORAL_CAPSULE | Freq: Every day | ORAL | 3 refills | Status: DC
  Filled 2024-05-21 – 2024-06-10 (×2): qty 30, 30d supply, fill #0
  Filled 2024-07-15: qty 30, 30d supply, fill #1
  Filled 2024-08-09: qty 30, 30d supply, fill #2

## 2024-05-21 MED ORDER — PRAZOSIN HCL 1 MG PO CAPS
3.0000 mg | ORAL_CAPSULE | Freq: Every day | ORAL | 3 refills | Status: DC
  Filled 2024-05-21 – 2024-06-10 (×2): qty 90, 30d supply, fill #0
  Filled 2024-07-15: qty 90, 30d supply, fill #1
  Filled 2024-08-09: qty 90, 30d supply, fill #2

## 2024-05-21 MED ORDER — AUSTEDO 9 MG PO TABS
18.0000 mg | ORAL_TABLET | Freq: Two times a day (BID) | ORAL | 3 refills | Status: DC
  Filled 2024-05-21: qty 120, 30d supply, fill #0
  Filled 2024-05-21: qty 120, fill #0
  Filled 2024-06-14: qty 120, 30d supply, fill #1
  Filled 2024-07-11: qty 120, 30d supply, fill #2
  Filled 2024-08-02: qty 120, 30d supply, fill #3

## 2024-05-21 NOTE — Progress Notes (Signed)
 Specialty Pharmacy Initial Fill Coordination Note  Fernando Garrison is a 58 y.o. male contacted today regarding initial fill of specialty medication(s) Deutetrabenazine  (Austedo )   Patient requested [name] at [facility] at [place] date: 05/22/24   Medication will be filled on 6/4.   Patient is aware of $4 copayment.

## 2024-05-21 NOTE — Progress Notes (Signed)
 Specialty Pharmacy Initiation Note   Fernando Garrison is a 58 y.o. male who will be followed by the specialty pharmacy service for RxSp Tardive Dyskinesia    Review of administration, indication, effectiveness, safety, potential side effects, storage/disposable, and missed dose instructions occurred today for patient's specialty medication(s) Deutetrabenazine  (Austedo )     Patient/Caregiver did not have any additional questions or concerns.   Patient's therapy is appropriate to: Initiate    Goals Addressed             This Visit's Progress    Reduce signs and symptoms       Patient is initiating therapy. Patient will maintain adherence        Will follow up in 6 months.   Malachi Screws Specialty Pharmacist

## 2024-05-21 NOTE — Progress Notes (Signed)
 BH MD/PA/NP OP Progress Note        05/21/2024 11:40 AM Fernando Garrison  MRN:  [id number]  Chief Complaint:"I am anxious and agitated"    HPI: 58 year old male seen today for follow-up psychiatric evaluation. Patient has a history of OCD, ADD, bipolar 1, tardive dyskinesia, antisocial personality disorder, substance use (sober 5 years), and PTSD and is currently managed on Straterra 80mg  daily (receives via patient care assistance), Austedo  18mg  twice daily  (receives via patient care assistance),and Prazosin  3mg  at bedtime.  Patient notes that he medications are effective in managing his psychiatric conditions.  Today he was well-groomed, visit, cooperative, and engaged in conversation.  He informed Clinical research associate that today he is anxious about not getting Austedo  approved. When Ms. Bignell is without his medication patient has increased symptoms of TD. Today provider conducted and Aims assessment and patient scored a 2. Patient has abnormal muscle movements in his leg and toes. Patient has not been on Austedo  18 mg twice daily as his insurance would not cover it. He was given a starter kit sample at his last visit and is now taking 6 mg twice daily. Patient continues to taper up. In the past the patient has also become suicidal when he is without Austedo  and has increased symptoms of TD.  Patient was insured and has [employer].  He noted that he prefers Medicaid and switched his insurance.  Patient has been receiving Austedo  from patient care assistance for over a year. Provider reapplied patient with [employer] patient care assistance but has not received approval or denial. Since the patient now has medicaid an Aims was conducted and will be resubmitted for approval by nursing staff.     Patient notes that the  above stressors exacerbates his anxiety and depression.  Today provider conducted a GAD-7 and he scored a 12, at his last visit he scored a 0.  Provider also conducted PHQ-9 he scored a 6,  at his last visit he scored a 0. He endorses adequate sleep and appetite.  Today he denies SI/HI/AVH, mania, or paranoia.    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  He reports that he finds enjoyment in his job.   Patient continues to have neck, back pain from his degenerative disc disease.    No medication changes made today.  Patient agreeable to continue medications as prescribed.     Visit Diagnosis:     ICD-10-CM   1. Tardive dyskinesia  G24.01 Deutetrabenazine  (AUSTEDO ) 9 MG TABS    2. Attention deficit hyperactivity disorder (ADHD), predominantly inattentive type  F90.0 prazosin  (MINIPRESS ) 1 MG capsule    atomoxetine  (STRATTERA ) 80 MG capsule            Past Psychiatric History: Patient reports he has a history of ADD, bipolar disorder, PTSD, OCD, substance use, and antisocial personality disorder   Past Medical History:  Past Medical History:  Diagnosis Date   PUD (peptic ulcer disease)     Past Surgical History:  Procedure Laterality Date   CERVICAL DISCECTOMY     MIDDLE EAR SURGERY      Family Psychiatric History: Mother adopted unknown mental issues. Niece ADHD, nephew autism, nephew schizophrenia and ADHD,  Family History: No family history on file.  Social History:  Social History   Socioeconomic History   Marital status: Single    Spouse name: Not on file   Number of children: Not on file   Years of  education: Not on file   Highest education level: Not on file  Occupational History   Not on file  Tobacco Use   Smoking status: Former    Types: Cigarettes   Smokeless tobacco: Never  Vaping Use   Vaping status: Every Day  Substance and Sexual Activity   Alcohol use: Not Currently   Drug use: Not Currently    Types: Marijuana   Sexual activity: Not on file  Other Topics Concern   Not on file  Social History Narrative   Not on file   Social Drivers of Health   Financial Resource Strain: Not on file   Food Insecurity: Low Risk  (01/23/2024)   Received from [facility]   Hunger Vital Sign    Worried About Running Out of Food in the Last Year: Never true    Ran Out of Food in the Last Year: Never true  Transportation Needs: No Transportation Needs (01/23/2024)   Received from [facility]    In the past 12 months, has lack of reliable transportation kept you from medical appointments, meetings, work or from getting things needed for daily living? : No  Physical Activity: Not on file  Stress: Not on file  Social Connections: Unknown (05/02/2022)   Received from [facility]   Social Network    Social Network: Not on file    Allergies: No Known Allergies  Metabolic Disorder Labs: No results found for: "HGBA1C", "MPG" No results found for: "PROLACTIN" No results found for: "CHOL", "TRIG", "HDL", "CHOLHDL", "VLDL", "LDLCALC" No results found for: "TSH"  Therapeutic Level Labs: No results found for: "LITHIUM" No results found for: "VALPROATE" No results found for: "CBMZ"  Current Medications: Current Outpatient Medications  Medication Sig Dispense Refill   atomoxetine  (STRATTERA ) 80 MG capsule Take 1 capsule (80 mg total) by mouth daily. 30 capsule 3   clindamycin  (CLEOCIN ) 300 MG capsule Take 1 capsule (300 mg total) by mouth 3 (three) times daily. 30 capsule 0   Deutetrabenazine  (AUSTEDO ) 9 MG TABS Take 2 tablets by mouth 2 (two) times daily. 120 tablet 3   ondansetron  (ZOFRAN -ODT) 4 MG disintegrating tablet Take 1 tablet (4 mg total) by mouth every 8 (eight) hours as needed. 20 tablet 0   pantoprazole  (PROTONIX ) 40 MG tablet Take 1 tablet (40 mg total) by mouth daily. 30 tablet 0   prazosin  (MINIPRESS ) 1 MG capsule Take 3 capsules (3 mg total) by mouth at bedtime. 90 capsule 3   No current facility-administered medications for this visit.   He denies  Musculoskeletal: Strength & Muscle Tone: within normal limits and telehealth visit Gait &  Station: normal, telehealth visit Patient leans: N/A  Psychiatric Specialty Exam: Review of Systems  Blood pressure (!) 147/95, pulse 96, resp. rate 16, height 5' 9.5" (1.765 m), weight 248 lb 9.6 oz (112.8 kg), SpO2 98%.Body mass index is 36.19 kg/m.  General Appearance: Well Groomed  Eye Contact:  Good  Speech:  Clear and Coherent and Normal Rate  Volume:  Normal  Mood:  Euthymic  Affect:  Appropriate and Congruent  Thought Process:  Coherent, Goal Directed and Linear  Orientation:  Full (Time, Place, and Person)  Thought Content: WDL and Logical   Suicidal Thoughts:  No  Homicidal Thoughts:  No  Memory:  Immediate;   Good Recent;   Good Remote;   Good  Judgement:  Good  Insight:  Good  Psychomotor Activity:  Normal  Concentration:  Concentration: Good and  Attention Span: Good  Recall:  Good  Fund of Knowledge: Good  Language: Good  Akathisia:  No  Handed:  Right  AIMS (if indicated):  done, 2  Assets:  Communication Skills Desire for Improvement Financial Resources/Insurance Housing Social Support  ADL's:  Intact  Cognition: WNL  Sleep:  Good   Screenings: AIMS    Flowsheet Row Clinical Support from [facility]/21/2024 in [facility] Video Visit from 04/12/2023 in [facility]  AIMS Total Score 2 5      GAD-7    Flowsheet Row Clinical Support from 05/21/2024 in [facility] Video Visit from 04/30/2024 in [facility] Video Visit from 02/07/2024 in [facility] Video Visit from 12/05/2023 in [facility] Video Visit from 09/12/2023 in [facility]  Total GAD-7 Score 12 0 1 0 0      PHQ2-9    Flowsheet Row Clinical Support from 05/21/2024 in [facility] Video Visit from 04/30/2024 in [facility] Video Visit from 02/07/2024 in  [facility] Video Visit from 12/05/2023 in [facility] Video Visit from 09/12/2023 in [facility]  PHQ-2 Total Score 1 0 2 0 0  PHQ-9 Total Score 6 0 3 -- 1      Flowsheet Row ED from 01/07/2024 in [facility] Emergency Department at [facility] UC from 10/20/2023 in [facility] at [facility] UC from 04/30/2023 in [facility] at [facility] RISK CATEGORY No Risk No Risk No Risk        Assessment and Plan: Patient reports that he has some anxiety related to his insurance and potential denial/approval of his medication.  He however notes that he is able to cope.  An Aims assessment was done at patients visit today and will be submitted to Medicaid to see if an approval will be granted. A renewal application to [employer] was also submitted for patient care assistance to see if Austedo  will be approved. Today patient agreeable to continue medications as prescribed.    1. Attention deficit hyperactivity disorder (ADHD), predominantly inattentive type  Continue- atomoxetine  (STRATTERA ) 80 MG capsule; Take 1 capsule (80 mg total) by mouth daily.  Dispense: 30 capsule; Refill: 3 Continue- prazosin  (MINIPRESS ) 1 MG capsule; Take 3 capsules (3 mg total) by mouth at bedtime.  Dispense: 90 capsule; Refill: 3  2. Tardive dyskinesia  Continue- Deutetrabenazine  (AUSTEDO ) 9 MG TABS; Take 18 mg by mouth 2 (two) times daily.    Follow-up in 3 months  Arlyne Bering, NP 05/21/2024, 11:40 AM

## 2024-05-21 NOTE — Progress Notes (Signed)
 Austedo  now going through [facility] plan. Copay is $4

## 2024-05-22 ENCOUNTER — Other Ambulatory Visit: Payer: Self-pay

## 2024-05-22 NOTE — Progress Notes (Signed)
 Pharmacy Patient Advocate Encounter  Received notification from [employer] that Prior Authorization for Austedo  has been APPROVED from 05/16/24 to 05/16/25

## 2024-05-27 ENCOUNTER — Other Ambulatory Visit (HOSPITAL_COMMUNITY): Payer: Self-pay

## 2024-06-10 ENCOUNTER — Other Ambulatory Visit: Payer: Self-pay

## 2024-06-11 ENCOUNTER — Other Ambulatory Visit: Payer: Self-pay

## 2024-06-14 ENCOUNTER — Other Ambulatory Visit: Payer: Self-pay

## 2024-06-14 NOTE — Progress Notes (Signed)
 Specialty Pharmacy Refill Coordination Note  Fernando Garrison is a 57 y.o. male contacted today regarding refills of specialty medication(s) Deutetrabenazine  (Austedo )   Patient requested Delivery   Delivery date: 06/18/24   Verified address: [address]  [place] [address]   Medication will be filled on 06/17/24.

## 2024-06-17 ENCOUNTER — Other Ambulatory Visit: Payer: Self-pay

## 2024-06-20 ENCOUNTER — Other Ambulatory Visit (HOSPITAL_COMMUNITY): Payer: Self-pay

## 2024-06-20 ENCOUNTER — Telehealth (HOSPITAL_COMMUNITY): Payer: Self-pay | Admitting: *Deleted

## 2024-06-20 NOTE — Telephone Encounter (Signed)
 Completed PA for his Austedo  via covermymeds and at the end of the application told it was not required as he has insurance. Last note indicated he had applied but writer unclear if he had it. I called his pharmacy and it was filled and sent to him earlier this week. He has [employer].

## 2024-06-27 ENCOUNTER — Telehealth (HOSPITAL_COMMUNITY): Payer: MEDICAID | Admitting: Psychiatry

## 2024-07-11 ENCOUNTER — Other Ambulatory Visit: Payer: Self-pay

## 2024-07-11 NOTE — Progress Notes (Signed)
 Specialty Pharmacy Refill Coordination Note  Fernando Garrison is a 58 y.o. male contacted today regarding refills of specialty medication(s) Deutetrabenazine  (Austedo )   Patient requested Delivery   Delivery date: 07/16/24   Verified address: [address]  [place] [address]   Medication will be filled on 07/15/24.

## 2024-07-15 ENCOUNTER — Other Ambulatory Visit: Payer: Self-pay

## 2024-08-01 ENCOUNTER — Telehealth (HOSPITAL_COMMUNITY): Payer: MEDICAID | Admitting: Family

## 2024-08-01 ENCOUNTER — Encounter (INDEPENDENT_AMBULATORY_CARE_PROVIDER_SITE_OTHER): Payer: Self-pay

## 2024-08-02 ENCOUNTER — Other Ambulatory Visit (HOSPITAL_COMMUNITY): Payer: Self-pay

## 2024-08-02 ENCOUNTER — Other Ambulatory Visit: Payer: Self-pay

## 2024-08-02 NOTE — Progress Notes (Signed)
 Specialty Pharmacy Refill Coordination Note  MyChart Questionnaire Submission  Fernando Garrison is a 58 y.o. male contacted today regarding refills of specialty medication(s) Austedo .  Doses on hand: (Patient-Rptd) 10 day - 72 tablets/18 days per [facility].  Patient requested: (Patient-Rptd) Delivery   Delivery date: 08/06/24  Verified address: [address] [name]  [id number]  Medication will be filled on 08/05/24.

## 2024-08-05 ENCOUNTER — Other Ambulatory Visit: Payer: Self-pay

## 2024-08-09 ENCOUNTER — Other Ambulatory Visit: Payer: Self-pay

## 2024-08-20 ENCOUNTER — Encounter (HOSPITAL_COMMUNITY): Payer: MEDICAID | Admitting: Physician Assistant

## 2024-08-22 ENCOUNTER — Telehealth (INDEPENDENT_AMBULATORY_CARE_PROVIDER_SITE_OTHER): Payer: MEDICAID | Admitting: Family

## 2024-08-22 ENCOUNTER — Encounter (HOSPITAL_COMMUNITY): Payer: Self-pay | Admitting: Family

## 2024-08-22 ENCOUNTER — Other Ambulatory Visit: Payer: Self-pay

## 2024-08-22 DIAGNOSIS — F900 Attention-deficit hyperactivity disorder, predominantly inattentive type: Secondary | ICD-10-CM | POA: Diagnosis not present

## 2024-08-22 DIAGNOSIS — G2401 Drug induced subacute dyskinesia: Secondary | ICD-10-CM

## 2024-08-22 DIAGNOSIS — F4310 Post-traumatic stress disorder, unspecified: Secondary | ICD-10-CM

## 2024-08-22 MED ORDER — AUSTEDO 9 MG PO TABS
18.0000 mg | ORAL_TABLET | Freq: Two times a day (BID) | ORAL | 3 refills | Status: DC
  Filled 2024-08-22 – 2024-09-10 (×3): qty 120, 30d supply, fill #0
  Filled 2024-10-08: qty 120, 30d supply, fill #1
  Filled 2024-11-07: qty 120, 30d supply, fill #2

## 2024-08-22 MED ORDER — ATOMOXETINE HCL 80 MG PO CAPS
80.0000 mg | ORAL_CAPSULE | Freq: Every day | ORAL | 3 refills | Status: DC
  Filled 2024-08-22 – 2024-09-12 (×2): qty 30, 30d supply, fill #0
  Filled 2024-10-11: qty 30, 30d supply, fill #1
  Filled 2024-11-06: qty 30, 30d supply, fill #2

## 2024-08-22 MED ORDER — PRAZOSIN HCL 1 MG PO CAPS
3.0000 mg | ORAL_CAPSULE | Freq: Every day | ORAL | 3 refills | Status: DC
  Filled 2024-08-22 – 2024-09-12 (×2): qty 90, 30d supply, fill #0
  Filled 2024-10-11: qty 90, 30d supply, fill #1
  Filled 2024-11-06: qty 90, 30d supply, fill #2

## 2024-08-22 NOTE — Progress Notes (Signed)
 Virtual Visit via Video Note  I connected with Fernando Garrison on 08/22/24 at 10:00 AM EDT by a video enabled telemedicine application and verified that I am speaking with the correct person using two identifiers.  Location: Patient: Home Provider: [facility]   I discussed the limitations of evaluation and management by telemedicine and the availability of in person appointments. The patient expressed understanding and agreed to proceed.    I discussed the assessment and treatment plan with the patient. The patient was provided an opportunity to ask questions and all were answered. The patient agreed with the plan and demonstrated an understanding of the instructions.   The patient was advised to call back or seek an in-person evaluation if the symptoms worsen or if the condition fails to improve as anticipated.  I provided 35 minutes of non-face-to-face time during this encounter.   Majel GORMAN Ramp, FNP BH MD/PA/NP OP Progress Note   08/22/2024 2:22 PM Fernando Garrison  MRN:  [id number]  Chief Complaint:I am actually doing good.     HPI: 58 year old male seen today for follow-up psychiatric evaluation. Patient has a history of OCD, ADD, bipolar 1, tardive dyskinesia, antisocial personality disorder, substance use (sober 5 years), and PTSD and is currently managed on Straterra 80mg  daily (receives via patient care assistance), Austedo  18mg  twice daily  (receives via patient care assistance),and Prazosin  3mg  at bedtime.  Patient notes that he medications are effective in managing his psychiatric conditions.  Patient presented well-groomed, cooperative, and engaged in conversation. He reports that his mental health remains stable, though he is currently worried about his physical health. He works as an Hotel manager and travels frequently; he recently completed an assignment in [place]  where he sustained multiple tick bites. Since then, he has been experiencing fatigue and weakness  and is scheduled for blood work today. He denies any adverse reactions or side effects to his current medications and reports compliance. He notes that these medical stressors have exacerbated his anxiety and depression. Today the provider conducted a GAD-7, on which he scored a 4 compared to a previous score of 0, and a PHQ-9, on which he scored [insert score] compared to a previous score of 0. He endorses adequate sleep and appetite. He denies suicidal ideation, homicidal ideation, auditory or visual hallucinations, mania, or paranoia. 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 He continues to vape, stating, "I wish I want to want to quit." No medication changes were made today, and the patient is agreeable to continue medications as prescribed.  Visit Diagnosis:     ICD-10-CM   1. Attention deficit hyperactivity disorder (ADHD), predominantly inattentive type  F90.0 atomoxetine  (STRATTERA ) 80 MG capsule    prazosin  (MINIPRESS ) 1 MG capsule    2. PTSD (post-traumatic stress disorder)  F43.10     3. Tardive dyskinesia  G24.01 Deutetrabenazine  (AUSTEDO ) 9 MG TABS       Past Psychiatric History: Patient reports he has a history of ADD, bipolar disorder, PTSD, OCD, substance use, and antisocial personality disorder   Past Medical History:  Past Medical History:  Diagnosis Date   PUD (peptic ulcer disease)     Past Surgical History:  Procedure Laterality Date   CERVICAL DISCECTOMY     MIDDLE EAR SURGERY      Family Psychiatric History: Mother adopted unknown mental issues. Niece ADHD, nephew autism, nephew schizophrenia and ADHD,  Family History: No family history on file.  Social History:  Social  History   Socioeconomic History   Marital status: Single    Spouse name: Not on file   Number of children: Not on file   Years of education: Not on file   Highest education level: Not on file  Occupational History   Not on file  Tobacco Use   Smoking  status: Former    Types: Cigarettes   Smokeless tobacco: Never  Vaping Use   Vaping status: Every Day  Substance and Sexual Activity   Alcohol use: Not Currently   Drug use: Not Currently    Types: Marijuana   Sexual activity: Not on file  Other Topics Concern   Not on file  Social History Narrative   Not on file   Social Drivers of Health   Financial Resource Strain: Not on file  Food Insecurity: Low Risk  (01/23/2024)   Received from [facility]   Hunger Vital Sign    Within the past 12 months, you worried that your food would run out before you got money to buy more: Never true    Within the past 12 months, the food you bought just didn't last and you didn't have money to get more. : Never true  Transportation Needs: No Transportation Needs (01/23/2024)   Received from [facility]    In the past 12 months, has lack of reliable transportation kept you from medical appointments, meetings, work or from getting things needed for daily living? : No  Physical Activity: Not on file  Stress: Not on file  Social Connections: Unknown (05/02/2022)   Received from [facility]   Social Network    Social Network: Not on file    Allergies: No Known Allergies  Metabolic Disorder Labs: No results found for: HGBA1C, MPG No results found for: PROLACTIN No results found for: CHOL, TRIG, HDL, CHOLHDL, VLDL, LDLCALC No results found for: TSH  Therapeutic Level Labs: No results found for: LITHIUM No results found for: VALPROATE No results found for: CBMZ  Current Medications: Current Outpatient Medications  Medication Sig Dispense Refill   atomoxetine  (STRATTERA ) 80 MG capsule Take 1 capsule (80 mg total) by mouth daily. 30 capsule 3   clindamycin  (CLEOCIN ) 300 MG capsule Take 1 capsule (300 mg total) by mouth 3 (three) times daily. 30 capsule 0   Deutetrabenazine  (AUSTEDO ) 9 MG TABS Take 2 tablets by mouth 2 (two) times daily. 120 tablet 3    ondansetron  (ZOFRAN -ODT) 4 MG disintegrating tablet Take 1 tablet (4 mg total) by mouth every 8 (eight) hours as needed. 20 tablet 0   pantoprazole  (PROTONIX ) 40 MG tablet Take 1 tablet (40 mg total) by mouth daily. 30 tablet 0   prazosin  (MINIPRESS ) 1 MG capsule Take 3 capsules (3 mg total) by mouth at bedtime. 90 capsule 3   No current facility-administered medications for this visit.   He denies  Musculoskeletal: Strength & Muscle Tone: within normal limits and telehealth visit Gait & Station: normal, telehealth visit Patient leans: N/A  Psychiatric Specialty Exam: Review of Systems  There were no vitals taken for this visit.There is no height or weight on file to calculate BMI.  General Appearance: Well Groomed  Eye Contact:  Good  Speech:  Clear and Coherent and Normal Rate  Volume:  Normal  Mood:  Euthymic  Affect:  Appropriate and Congruent  Thought Process:  Coherent, Goal Directed and Linear  Orientation:  Full (Time, Place, and Person)  Thought Content: WDL and Logical   Suicidal Thoughts:  No  Homicidal Thoughts:  No  Memory:  Immediate;   Good Recent;   Good Remote;   Good  Judgement:  Good  Insight:  Good  Psychomotor Activity:  Normal  Concentration:  Concentration: Good and Attention Span: Good  Recall:  Good  Fund of Knowledge: Good  Language: Good  Akathisia:  No  Handed:  Right  AIMS (if indicated):  done, 2  Assets:  Communication Skills Desire for Improvement Financial Resources/Insurance Housing Social Support  ADL's:  Intact  Cognition: WNL  Sleep:  Good   Screenings: AIMS    Flowsheet Row Clinical Support from 05/21/2024 in [facility] Video Visit from 04/12/2023 in [facility]  AIMS Total Score 2 5   GAD-7    Flowsheet Row Clinical Support from 05/21/2024 in [facility] Video Visit from 04/30/2024 in [facility] Video Visit  from 02/07/2024 in [facility] Video Visit from 12/05/2023 in [facility] Video Visit from 09/12/2023 in [facility]  Total GAD-7 Score 12 0 1 0 0   PHQ2-9    Flowsheet Row Clinical Support from 05/21/2024 in [facility] Video Visit from 04/30/2024 in [facility] Video Visit from 02/07/2024 in [facility] Video Visit from 12/05/2023 in [facility] Video Visit from 09/12/2023 in [facility]  PHQ-2 Total Score 1 0 2 0 0  PHQ-9 Total Score 6 0 3 -- 1   Flowsheet Row ED from 01/07/2024 in [facility] Emergency Department at [facility] UC from 10/20/2023 in [facility] at [facility] UC from 04/30/2023 in [facility] Urgent Care at [facility] RISK CATEGORY No Risk No Risk No Risk     Assessment and Plan: Mentally stable at this time. No changes made to his medication regimen.   1. Attention deficit hyperactivity disorder (ADHD), predominantly inattentive type  Continue- atomoxetine  (STRATTERA ) 80 MG capsule; Take 1 capsule (80 mg total) by mouth daily.  Dispense: 30 capsule; Refill: 3 Continue- prazosin  (MINIPRESS ) 1 MG capsule; Take 3 capsules (3 mg total) by mouth at bedtime.  Dispense: 90 capsule; Refill: 3  2. Tardive dyskinesia  Continue- Deutetrabenazine  (AUSTEDO ) 9 MG TABS; Take 18 mg by mouth 2 (two) times daily.    Follow-up in 3 months  Majel GORMAN Ramp, FNP 08/22/2024, 2:22 PM

## 2024-08-23 ENCOUNTER — Other Ambulatory Visit: Payer: Self-pay

## 2024-08-23 ENCOUNTER — Encounter (HOSPITAL_COMMUNITY): Payer: Self-pay

## 2024-08-23 ENCOUNTER — Ambulatory Visit (HOSPITAL_COMMUNITY)
Admission: RE | Admit: 2024-08-23 | Discharge: 2024-08-23 | Disposition: A | Discharge status: Home / Self Care | Payer: MEDICAID | Source: Ambulatory Visit | Attending: Emergency Medicine | Admitting: Emergency Medicine

## 2024-08-23 VITALS — BP 113/81 | HR 105 | Temp 98.1°F | Resp 20

## 2024-08-23 DIAGNOSIS — B356 Tinea cruris: Secondary | ICD-10-CM | POA: Diagnosis not present

## 2024-08-23 DIAGNOSIS — R5383 Other fatigue: Secondary | ICD-10-CM | POA: Diagnosis not present

## 2024-08-23 DIAGNOSIS — M2550 Pain in unspecified joint: Secondary | ICD-10-CM | POA: Diagnosis not present

## 2024-08-23 DIAGNOSIS — S70362A Insect bite (nonvenomous), left thigh, initial encounter: Secondary | ICD-10-CM

## 2024-08-23 DIAGNOSIS — W57XXXA Bitten or stung by nonvenomous insect and other nonvenomous arthropods, initial encounter: Secondary | ICD-10-CM

## 2024-08-23 MED ORDER — PREDNISONE 10 MG (21) PO TBPK
ORAL_TABLET | Freq: Every day | ORAL | 0 refills | Status: DC
  Filled 2024-08-23: qty 21, 6d supply, fill #0

## 2024-08-23 MED ORDER — DOXYCYCLINE HYCLATE 100 MG PO TABS
100.0000 mg | ORAL_TABLET | Freq: Two times a day (BID) | ORAL | 0 refills | Status: AC
  Filled 2024-08-23: qty 28, 14d supply, fill #0

## 2024-08-23 MED ORDER — CLOTRIMAZOLE 1 % EX CREA
TOPICAL_CREAM | CUTANEOUS | 0 refills | Status: DC
  Filled 2024-08-23: qty 60, 30d supply, fill #0

## 2024-08-23 NOTE — Discharge Instructions (Addendum)
 Take the doxycycline  twice daily for the next 14 days.  Take it with food.  This medication may make you more prone to sunburn so wear additional layers are SPF.  The rash in your groin appears to be jock itch, use the clotrimazole  twice daily.  This may take 2 to 3 weeks to work.  If you find that this is not working you can trial the steroid Dosepak, take this in the morning with breakfast.  Try to keep the groin area clean and dry, wear cotton underwear and change underwear frequently as needed.  Please follow-up with a primary care provider if symptoms persist.

## 2024-08-23 NOTE — ED Triage Notes (Signed)
 Patient presents to the office for  rash on his left side near his groin area. Patient reports he has been in contact with tick over the last couple of weeks.  Patient reports fatigue and joint pain

## 2024-08-23 NOTE — ED Provider Notes (Signed)
 [facility]    CSN: [id number] Arrival date & time: 08/23/24  9043      History   Chief Complaint Chief Complaint  Patient presents with   Rash   Tick Removal    HPI Fernando Garrison is a 58 y.o. male.   Patient presents to clinic with concerns over a rash to the groin area, fatigue, joint pain and tick bite.  Fernando Garrison a lot for work and has had 2 confirmed cases of Lyme's disease over the past few months.  Has been bitten by multiple ticks over the summer.  Most bothersome was 1 to the left distal thigh that he had trouble removing.  Ever since removal he has noticed fatigue and joint pain to his elbows and knees.  Has not had fever.  No rash around the bite site.  Ongoing issues with a rash to the groin area.  Has tried multiple topical medications in the past for jock itch and these did not help.  Reports a tapered dose of steroids or the seem to clear the rash.  He does get hot and sweaty and is frequently working outside.  The history is provided by the patient and medical records.  Rash   Past Medical History:  Diagnosis Date   PUD (peptic ulcer disease)     Patient Active Problem List   Diagnosis Date Noted   Generalized anxiety disorder 09/25/2020   Attention deficit hyperactivity disorder (ADHD), predominantly inattentive type 08/12/2020   Bipolar I disorder, most recent episode depressed (HCC) 08/12/2020   PTSD (post-traumatic stress disorder) 08/12/2020   Tardive dyskinesia 08/12/2020    Past Surgical History:  Procedure Laterality Date   CERVICAL DISCECTOMY     MIDDLE EAR SURGERY         Home Medications    Prior to Admission medications   Medication Sig Start Date End Date Taking? Authorizing Provider  atomoxetine  (STRATTERA ) 80 MG capsule Take 1 capsule (80 mg total) by mouth daily. 08/22/24  Yes Starkes-Perry, Majel RAMAN, FNP  clotrimazole  (LOTRIMIN ) 1 % cream Apply to affected area 2 times daily 08/23/24  Yes Ona Rathert  N, FNP   Deutetrabenazine  (AUSTEDO ) 9 MG TABS Take 2 tablets by mouth 2 (two) times daily. 08/22/24  Yes Starkes-Perry, Majel RAMAN, FNP  doxycycline  (VIBRA -TABS) 100 MG tablet Take 1 tablet (100 mg total) by mouth 2 (two) times daily for 14 days. 08/23/24 09/06/24 Yes Shell Yandow  N, FNP  predniSONE  (STERAPRED UNI-PAK 21 TAB) 10 MG (21) TBPK tablet Take by mouth daily. Take as prescribed. 08/23/24  Yes Isaac Lacson  N, FNP  clindamycin  (CLEOCIN ) 300 MG capsule Take 1 capsule (300 mg total) by mouth 3 (three) times daily. 11/13/23     ondansetron  (ZOFRAN -ODT) 4 MG disintegrating tablet Take 1 tablet (4 mg total) by mouth every 8 (eight) hours as needed. 01/07/24   Long, Fonda MATSU, MD  pantoprazole  (PROTONIX ) 40 MG tablet Take 1 tablet (40 mg total) by mouth daily. 01/07/24 02/08/24  LongFonda MATSU, MD  prazosin  (MINIPRESS ) 1 MG capsule Take 3 capsules (3 mg total) by mouth at bedtime. 08/22/24   Starkes-Perry, Majel RAMAN, FNP  ARIPiprazole  (ABILIFY ) 5 MG tablet Take 1 tablet (5 mg total) by mouth daily. 01/21/21 02/16/21  Harl Zane BRAVO, NP  benztropine  (COGENTIN ) 0.5 MG tablet Take 2 tablets (1 mg total) by mouth 2 (two) times daily. 09/25/20 01/21/21  Harl Zane BRAVO, NP    Family History History reviewed. No pertinent family history.  Social History Social  History   Tobacco Use   Smoking status: Former    Types: Cigarettes   Smokeless tobacco: Never  Vaping Use   Vaping status: Every Day  Substance Use Topics   Alcohol use: Not Currently   Drug use: Not Currently    Types: Marijuana     Allergies   Patient has no known allergies.   Review of Systems Review of Systems  Per HPI  Physical Exam Triage Vital Signs ED Triage Vitals  Encounter Vitals Group     BP 08/23/24 1031 113/81     Girls Systolic BP Percentile --      Girls Diastolic BP Percentile --      Boys Systolic BP Percentile --      Boys Diastolic BP Percentile --      Pulse Rate 08/23/24 1031 (!) 105     Resp 08/23/24 1031  20     Temp 08/23/24 1031 98.1 F (36.7 C)     Temp Source 08/23/24 1031 Oral     SpO2 08/23/24 1031 95 %     Weight --      Height --      Head Circumference --      Peak Flow --      Pain Score 08/23/24 1034 0     Pain Loc --      Pain Education --      Exclude from Growth Chart --    No data found.  Updated Vital Signs BP 113/81 (BP Location: Left Arm)   Pulse (!) 105   Temp 98.1 F (36.7 C) (Oral)   Resp 20   SpO2 95%   Visual Acuity Right Eye Distance:   Left Eye Distance:   Bilateral Distance:    Right Eye Near:   Left Eye Near:    Bilateral Near:     Physical Exam Vitals and nursing note reviewed.  Constitutional:      Appearance: Normal appearance.  HENT:     Head: Normocephalic and atraumatic.     Right Ear: External ear normal.     Left Ear: External ear normal.     Nose: Nose normal.     Mouth/Throat:     Mouth: Mucous membranes are moist.  Eyes:     Conjunctiva/sclera: Conjunctivae normal.  Cardiovascular:     Rate and Rhythm: Normal rate.  Pulmonary:     Effort: Pulmonary effort is normal. No respiratory distress.  Genitourinary:    Pubic Area: Rash present.       Comments: Erythematous pruritic rash to the left groin area consistent with jock itch. Musculoskeletal:        General: Normal range of motion.  Skin:    General: Skin is warm and dry.     Findings: Rash present.         Comments: Firm skin/morning to the left distal thigh over area of tick bite, without erythema migrans.  Without warmth or drainage, low concern for cellulitis.  Neurological:     General: No focal deficit present.     Mental Status: He is alert and oriented to person, place, and time.  Psychiatric:        Mood and Affect: Mood normal.        Behavior: Behavior normal.      UC Treatments / Results  Labs (all labs ordered are listed, but only abnormal results are displayed) Labs Reviewed - No data to display  EKG   Radiology No results  found.  Procedures Procedures (including critical care time)  Medications Ordered in UC Medications - No data to display  Initial Impression / Assessment and Plan / UC Course  I have reviewed the triage vital signs and the nursing notes.  Pertinent labs & imaging results that were available during my care of the patient were reviewed by me and considered in my medical decision making (see chart for details).  Vitals and triage reviewed, patient is hemodynamically stable.  Examination of the left groin reveals rash consistent with jock itch, will treat with clotrimazole .  Patient adamant that this treatment does not work and request steroid taper pack, will try.  May help with joint pain and fatigue as well.  Multiple confirmed cases of Lyme disease, recent travel to area where Lyme disease is prevalent.  Does not have erythema migrans but does have joint pain and fatigue.  Will treat with doxycycline  twice daily for 14 days for Lyme coverage.  Plan of care, follow-up care return precautions given, no questions at this time.  Work note provided.     Final Clinical Impressions(s) / UC Diagnoses   Final diagnoses:  Tick bite of left thigh, initial encounter  Jock itch  Arthralgia, unspecified joint  Other fatigue     Discharge Instructions      Take the doxycycline  twice daily for the next 14 days.  Take it with food.  This medication may make you more prone to sunburn so wear additional layers are SPF.  The rash in your groin appears to be jock itch, use the clotrimazole  twice daily.  This may take 2 to 3 weeks to work.  If you find that this is not working you can trial the steroid Dosepak, take this in the morning with breakfast.  Try to keep the groin area clean and dry, wear cotton underwear and change underwear frequently as needed.  Please follow-up with a primary care provider if symptoms persist.     ED Prescriptions     Medication Sig Dispense Auth. Provider    doxycycline  (VIBRA -TABS) 100 MG tablet Take 1 tablet (100 mg total) by mouth 2 (two) times daily for 14 days. 28 tablet Zaydan Papesh  N, FNP   clotrimazole  (LOTRIMIN ) 1 % cream Apply to affected area 2 times daily 60 g Arlisha Patalano  N, FNP   predniSONE  (STERAPRED UNI-PAK 21 TAB) 10 MG (21) TBPK tablet Take by mouth daily. Take as prescribed. 21 tablet Dreama, Hennessy Bartel  N, FNP      PDMP not reviewed this encounter.   Dreama Marny SAILOR, [place] 08/23/24 [phone]

## 2024-09-10 ENCOUNTER — Other Ambulatory Visit: Payer: Self-pay

## 2024-09-10 ENCOUNTER — Other Ambulatory Visit (HOSPITAL_COMMUNITY): Payer: Self-pay

## 2024-09-10 NOTE — Progress Notes (Signed)
 Specialty Pharmacy Refill Coordination Note  Fernando Garrison is a 58 y.o. male contacted today regarding refills of specialty medication(s) Deutetrabenazine  (Austedo )   Patient requested Delivery   Delivery date: 09/12/24   Verified address: [address] [place] [address]   Medication will be filled on 09/11/24.

## 2024-09-11 ENCOUNTER — Other Ambulatory Visit: Payer: Self-pay

## 2024-09-12 ENCOUNTER — Other Ambulatory Visit: Payer: Self-pay

## 2024-10-08 ENCOUNTER — Other Ambulatory Visit: Payer: Self-pay

## 2024-10-08 NOTE — Progress Notes (Signed)
 Specialty Pharmacy Refill Coordination Note  Fernando Garrison is a 58 y.o. male contacted today regarding refills of specialty medication(s) Deutetrabenazine  (Austedo )   Patient requested Delivery   Delivery date: 10/11/24   Verified address: [address], [place] [address]   Medication will be filled on 10/10/24.

## 2024-10-09 ENCOUNTER — Other Ambulatory Visit: Payer: Self-pay

## 2024-10-11 ENCOUNTER — Other Ambulatory Visit: Payer: Self-pay

## 2024-11-06 ENCOUNTER — Other Ambulatory Visit: Payer: Self-pay

## 2024-11-07 ENCOUNTER — Other Ambulatory Visit: Payer: Self-pay

## 2024-11-07 ENCOUNTER — Other Ambulatory Visit (HOSPITAL_COMMUNITY): Payer: Self-pay

## 2024-11-07 NOTE — Progress Notes (Signed)
 Specialty Pharmacy Refill Coordination Note  Fernando Garrison is a 58 y.o. male contacted today regarding refills of specialty medication(s) Deutetrabenazine  (Austedo )   Patient requested (Patient-Rptd) Delivery   Delivery date: 11/11/24   Verified address: (Patient-Rptd) [address] [name] [address]   Medication will be filled on: 11/08/24

## 2024-11-08 ENCOUNTER — Other Ambulatory Visit: Payer: Self-pay

## 2024-11-18 ENCOUNTER — Encounter (HOSPITAL_COMMUNITY): Payer: Self-pay | Admitting: Emergency Medicine

## 2024-11-18 ENCOUNTER — Other Ambulatory Visit: Payer: Self-pay

## 2024-11-18 ENCOUNTER — Ambulatory Visit (HOSPITAL_COMMUNITY)
Admission: EM | Admit: 2024-11-18 | Discharge: 2024-11-18 | Disposition: A | Discharge status: Home / Self Care | Payer: MEDICAID | Attending: Nurse Practitioner | Admitting: Nurse Practitioner

## 2024-11-18 DIAGNOSIS — Z7289 Other problems related to lifestyle: Secondary | ICD-10-CM

## 2024-11-18 DIAGNOSIS — H66001 Acute suppurative otitis media without spontaneous rupture of ear drum, right ear: Secondary | ICD-10-CM

## 2024-11-18 DIAGNOSIS — B349 Viral infection, unspecified: Secondary | ICD-10-CM | POA: Diagnosis not present

## 2024-11-18 MED ORDER — GUAIFENESIN ER 600 MG PO TB12
600.0000 mg | ORAL_TABLET | Freq: Two times a day (BID) | ORAL | 0 refills | Status: DC | PRN
  Filled 2024-11-18: qty 30, 15d supply, fill #0

## 2024-11-18 MED ORDER — ALBUTEROL SULFATE (2.5 MG/3ML) 0.083% IN NEBU
2.5000 mg | INHALATION_SOLUTION | Freq: Once | RESPIRATORY_TRACT | Status: AC
  Administered 2024-11-18: 2.5 mg via RESPIRATORY_TRACT

## 2024-11-18 MED ORDER — ALBUTEROL SULFATE HFA 108 (90 BASE) MCG/ACT IN AERS
1.0000 | INHALATION_SPRAY | Freq: Four times a day (QID) | RESPIRATORY_TRACT | 0 refills | Status: DC | PRN
  Filled 2024-11-18: qty 18, 25d supply, fill #0

## 2024-11-18 MED ORDER — ALBUTEROL SULFATE (2.5 MG/3ML) 0.083% IN NEBU
INHALATION_SOLUTION | RESPIRATORY_TRACT | Status: AC
  Filled 2024-11-18: qty 3

## 2024-11-18 MED ORDER — AMOXICILLIN 875 MG PO TABS
875.0000 mg | ORAL_TABLET | Freq: Two times a day (BID) | ORAL | 0 refills | Status: AC
  Filled 2024-11-18: qty 10, 5d supply, fill #0

## 2024-11-18 MED ORDER — BENZONATATE 100 MG PO CAPS
100.0000 mg | ORAL_CAPSULE | Freq: Three times a day (TID) | ORAL | 0 refills | Status: DC | PRN
  Filled 2024-11-18: qty 21, 7d supply, fill #0

## 2024-11-18 NOTE — ED Provider Notes (Addendum)
 [facility]    CSN: [id number] Arrival date & time: 11/18/24  1104      History   Chief Complaint Chief Complaint  Patient presents with   Cough    HPI Fernando Garrison is a 58 y.o. male.   Patient presents today with 4-day history of tactile fever, chills and intermittent sweating, congested, nonproductive cough, shortness of breath with activity, chest pain when he coughs, runny and stuffy nose, sore throat, headache, bilateral ear pain that feels like hot boogers, decreased appetite, and fatigue.  No abdominal pain, nausea/vomiting, or diarrhea.  No known sick contacts.  Has been taking Alka-Seltzer and Robitussin for symptoms with minimal temporary improvement.  Reports he currently vapes.    Past Medical History:  Diagnosis Date   PUD (peptic ulcer disease)     Patient Active Problem List   Diagnosis Date Noted   Generalized anxiety disorder 09/25/2020   Attention deficit hyperactivity disorder (ADHD), predominantly inattentive type 08/12/2020   Bipolar I disorder, most recent episode depressed (HCC) 08/12/2020   PTSD (post-traumatic stress disorder) 08/12/2020   Tardive dyskinesia 08/12/2020    Past Surgical History:  Procedure Laterality Date   CERVICAL DISCECTOMY     MIDDLE EAR SURGERY         Home Medications    Prior to Admission medications   Medication Sig Start Date End Date Taking? Authorizing Provider  albuterol (VENTOLIN HFA) 108 (90 Base) MCG/ACT inhaler Inhale 1-2 puffs into the lungs every 6 (six) hours as needed for wheezing or shortness of breath. 11/18/24  Yes Chandra Harlene LABOR, NP  amoxicillin  (AMOXIL ) 875 MG tablet Take 1 tablet (875 mg total) by mouth 2 (two) times daily for 5 days. 11/18/24 11/23/24 Yes Chandra Harlene LABOR, NP  benzonatate (TESSALON) 100 MG capsule Take 1 capsule (100 mg total) by mouth 3 (three) times daily as needed for cough. Do not take with alcohol or while operating or driving heavy machinery 87/8/74  Yes  Chandra Harlene A, NP  guaiFENesin (MUCINEX) 600 MG 12 hr tablet Take 1 tablet (600 mg total) by mouth 2 (two) times daily as needed for cough or to loosen phlegm. 11/18/24  Yes Chandra Harlene LABOR, NP  atomoxetine  (STRATTERA ) 80 MG capsule Take 1 capsule (80 mg total) by mouth daily. 08/22/24   Wilkie Majel RAMAN, FNP  clotrimazole  (LOTRIMIN ) 1 % cream Apply to affected area 2 times daily Patient not taking: Reported on 11/18/2024 08/23/24   Dreama, [place]  N, FNP  Deutetrabenazine  (AUSTEDO ) 9 MG TABS Take 2 tablets by mouth 2 (two) times daily. 08/22/24   Starkes-Perry, Majel RAMAN, FNP  prazosin  (MINIPRESS ) 1 MG capsule Take 3 capsules (3 mg total) by mouth at bedtime. 08/22/24   Starkes-Perry, Majel RAMAN, FNP  ARIPiprazole  (ABILIFY ) 5 MG tablet Take 1 tablet (5 mg total) by mouth daily. 01/21/21 02/16/21  Harl Zane BRAVO, NP  benztropine  (COGENTIN ) 0.5 MG tablet Take 2 tablets (1 mg total) by mouth 2 (two) times daily. 09/25/20 01/21/21  Harl Zane BRAVO, NP    Family History History reviewed. No pertinent family history.  Social History Social History   Tobacco Use   Smoking status: Former    Types: Cigarettes   Smokeless tobacco: Never  Vaping Use   Vaping status: Every Day  Substance Use Topics   Alcohol use: Not Currently   Drug use: Not Currently    Types: Marijuana     Allergies   Patient has no known allergies.   Review of Systems  Review of Systems Per HPI  Physical Exam Triage Vital Signs ED Triage Vitals  Encounter Vitals Group     BP 11/18/24 1257 117/80     Girls Systolic BP Percentile --      Girls Diastolic BP Percentile --      Boys Systolic BP Percentile --      Boys Diastolic BP Percentile --      Pulse Rate 11/18/24 1257 98     Resp 11/18/24 1257 20     Temp 11/18/24 1257 98 F (36.7 C)     Temp Source 11/18/24 1257 Oral     SpO2 11/18/24 1257 94 %     Weight --      Height --      Head Circumference --      Peak Flow --      Pain Score 11/18/24  1254 8     Pain Loc --      Pain Education --      Exclude from Growth Chart --    No data found.  Updated Vital Signs BP 117/80 (BP Location: Left Arm) Comment (BP Location): large cuff  Pulse 98   Temp 98 F (36.7 C) (Oral)   Resp 20   SpO2 94%   Visual Acuity Right Eye Distance:   Left Eye Distance:   Bilateral Distance:    Right Eye Near:   Left Eye Near:    Bilateral Near:     Physical Exam Vitals and nursing note reviewed.  Constitutional:      General: He is not in acute distress.    Appearance: Normal appearance. He is not ill-appearing or toxic-appearing.  HENT:     Head: Normocephalic and atraumatic.     Right Ear: Ear canal and external ear normal. Tympanic membrane is erythematous and bulging.     Left Ear: Ear canal and external ear normal. Tympanic membrane is bulging.     Nose: Congestion present. No rhinorrhea.     Mouth/Throat:     Mouth: Mucous membranes are moist.     Pharynx: Oropharynx is clear. No oropharyngeal exudate or posterior oropharyngeal erythema.  Eyes:     General: No scleral icterus.    Extraocular Movements: Extraocular movements intact.  Cardiovascular:     Rate and Rhythm: Normal rate and regular rhythm.  Pulmonary:     Effort: Pulmonary effort is normal. No respiratory distress.     Breath sounds: Normal breath sounds. Decreased air movement present. No wheezing, rhonchi or rales.  Musculoskeletal:     Cervical back: Normal range of motion and neck supple.  Lymphadenopathy:     Cervical: No cervical adenopathy.  Skin:    General: Skin is warm and dry.     Coloration: Skin is not jaundiced or pale.     Findings: No erythema or rash.  Neurological:     Mental Status: He is alert and oriented to person, place, and time.  Psychiatric:        Behavior: Behavior is cooperative.      UC Treatments / Results  Labs (all labs ordered are listed, but only abnormal results are displayed) Labs Reviewed - No data to  display  EKG   Radiology No results found.  Procedures Procedures (including critical care time)  Medications Ordered in UC Medications  albuterol (PROVENTIL) (2.5 MG/3ML) 0.083% nebulizer solution 2.5 mg (2.5 mg Nebulization Given 11/18/24 1326)    Initial Impression / Assessment and Plan / UC Course  I  have reviewed the triage vital signs and the nursing notes.  Pertinent labs & imaging results that were available during my care of the patient were reviewed by me and considered in my medical decision making (see chart for details).     Patient is a well-appearing 58 year old male presenting for viral upper respiratory infection symptoms.  Vital signs are stable.  Viral testing was deferred given length of symptoms.  On exam, he has otitis media of the right ear, left ear effusion, decreased air movement to bilateral lung lobes.  A breathing treatment was given with increase in SpO2 from 94% to 98% on room air.  Will treat for acute otitis media with amoxicillin  875 mg twice daily for 5 days.  Other supportive care discussed including cough suppressant medication, albuterol  inhaler as needed for wheezing or shortness of breath, Mucinex , and cough suppressant medication.  ER and return precautions were discussed.  Work excuse was provided.  Final Clinical Impressions(s) / UC Diagnoses   Final diagnoses:  Viral illness  Non-recurrent acute suppurative otitis media of right ear without spontaneous rupture of tympanic membrane  Current every day vaping     Discharge Instructions      You have a viral upper respiratory infection and an ear infection.  Symptoms should improve over the next few days.  If your chest pain worsens or you develop shortness of breath, go to the emergency room.  We gave you a breathing treatment today which helped open up your airway.  Continue the albuterol  inhaler at home every 6 hours scheduled for the next 2 days then use as needed.  Recommend that you  quit vaping.  Some things that can make you feel better are: - Increased rest - Increasing fluid with water/sugar free electrolytes - Acetaminophen  and ibuprofen  as needed for fever/pain - Salt water gargling, chloraseptic spray and throat lozenges - OTC guaifenesin  (Mucinex ) 600 mg twice daily for congestion - Saline sinus flushes or a neti pot - Humidifying the air -Tessalon  Perles every 8 hours as needed for dry cough   You also have an ear infection; take the Amoxicillin  as prescribed to treat it.       ED Prescriptions     Medication Sig Dispense Auth. Provider   benzonatate  (TESSALON ) 100 MG capsule Take 1 capsule (100 mg total) by mouth 3 (three) times daily as needed for cough. Do not take with alcohol or while operating or driving heavy machinery 21 capsule Chandra Raisin A, NP   albuterol  (VENTOLIN  HFA) 108 (90 Base) MCG/ACT inhaler Inhale 1-2 puffs into the lungs every 6 (six) hours as needed for wheezing or shortness of breath. 18 g Chandra Raisin A, NP   guaiFENesin  (MUCINEX ) 600 MG 12 hr tablet Take 1 tablet (600 mg total) by mouth 2 (two) times daily as needed for cough or to loosen phlegm. 30 tablet Chandra Raisin A, NP   amoxicillin  (AMOXIL ) 875 MG tablet Take 1 tablet (875 mg total) by mouth 2 (two) times daily for 5 days. 10 tablet Chandra Raisin LABOR, NP      PDMP not reviewed this encounter.   Chandra Raisin LABOR, NP 11/18/24 1448    Chandra Raisin LABOR, NP 11/18/24 [phone]

## 2024-11-18 NOTE — Discharge Instructions (Signed)
 You have a viral upper respiratory infection and an ear infection.  Symptoms should improve over the next few days.  If your chest pain worsens or you develop shortness of breath, go to the emergency room.  We gave you a breathing treatment today which helped open up your airway.  Continue the albuterol inhaler at home every 6 hours scheduled for the next 2 days then use as needed.  Recommend that you quit vaping.  Some things that can make you feel better are: - Increased rest - Increasing fluid with water/sugar free electrolytes - Acetaminophen  and ibuprofen  as needed for fever/pain - Salt water gargling, chloraseptic spray and throat lozenges - OTC guaifenesin (Mucinex) 600 mg twice daily for congestion - Saline sinus flushes or a neti pot - Humidifying the air -Tessalon Perles every 8 hours as needed for dry cough   You also have an ear infection; take the Amoxicillin  as prescribed to treat it.

## 2024-11-18 NOTE — ED Triage Notes (Signed)
 Cough, fatigue, chills and sob for 4 days.  Denies any phlegm production with coughing.  Pain across chest when coughing.  Reports ears hurting, stuffy nose.    Has taken robitussin and alka seltzer

## 2024-11-18 NOTE — ED Notes (Signed)
 Reviewed work note

## 2024-11-21 ENCOUNTER — Other Ambulatory Visit: Payer: Self-pay

## 2024-11-21 ENCOUNTER — Encounter (HOSPITAL_COMMUNITY): Payer: Self-pay | Admitting: Psychiatry

## 2024-11-21 ENCOUNTER — Telehealth (INDEPENDENT_AMBULATORY_CARE_PROVIDER_SITE_OTHER): Payer: MEDICAID | Admitting: Psychiatry

## 2024-11-21 DIAGNOSIS — F900 Attention-deficit hyperactivity disorder, predominantly inattentive type: Secondary | ICD-10-CM

## 2024-11-21 DIAGNOSIS — G2401 Drug induced subacute dyskinesia: Secondary | ICD-10-CM

## 2024-11-21 MED ORDER — PRAZOSIN HCL 1 MG PO CAPS
3.0000 mg | ORAL_CAPSULE | Freq: Every day | ORAL | 3 refills | Status: DC
  Filled 2024-11-21 – 2024-12-11 (×4): qty 90, 30d supply, fill #0
  Filled 2025-01-09: qty 90, 30d supply, fill #1

## 2024-11-21 MED ORDER — ATOMOXETINE HCL 80 MG PO CAPS
80.0000 mg | ORAL_CAPSULE | Freq: Every day | ORAL | 3 refills | Status: DC
  Filled 2024-11-21 – 2024-12-11 (×4): qty 30, 30d supply, fill #0
  Filled 2025-01-09: qty 30, 30d supply, fill #1

## 2024-11-21 MED ORDER — AUSTEDO 9 MG PO TABS
18.0000 mg | ORAL_TABLET | Freq: Two times a day (BID) | ORAL | 3 refills | Status: DC
  Filled 2024-11-21 – 2024-11-29 (×2): qty 120, 30d supply, fill #0
  Filled 2024-12-25: qty 120, 30d supply, fill #1

## 2024-11-21 NOTE — Progress Notes (Signed)
 BH MD/PA/NP OP Progress Note Virtual Visit via Video Note  I connected with Norleen Lango on 11/21/24 at  1:00 PM EST by a video enabled telemedicine application and verified that I am speaking with the correct person using two identifiers.  Location: Patient: Fernando Garrison Provider: Clinic   I discussed the limitations of evaluation and management by telemedicine and the availability of in person appointments. The patient expressed understanding and agreed to proceed.  I provided 30 minutes of non-face-to-face time during this encounter.          11/21/2024 1:17 PM Sie Formisano  MRN:  [id number]  Chief Complaint:I will be losing my medicaid    HPI: 58 year old male seen today for follow-up psychiatric evaluation. Patient has a history of OCD, ADD, bipolar 1, tardive dyskinesia, antisocial personality disorder, substance use (sober 5 years), and PTSD and is currently managed on Straterra 80mg  daily (receives via patient care assistance), Austedo  18mg  twice daily  (receives via patient care assistance),and Prazosin  3mg  at bedtime.  Patient notes that he medications are effective in managing his psychiatric conditions.  Today he was well-groomed, visit, cooperative, and engaged in conversation.  He informed clinical research associate that he has been sick since Thanksgiving. He notes that he has had a cough and poor breathing. He informed clinical research associate that her went to his doctor and was started on an antibiotic and an inhaler. He continues to note that he has fatigue and a productive cough.   Mentally he notes that he is in a good place. He does note that he is concerned about his medicaid. He notes that in February or March his medicaid will run out. He was told by Ms. ROCKY GLADDEN that he has to make less than 17,000 a year to qualify. He informed clinical research associate that he is looking into getting new insurance.  Provider informed patient that if Austedo  is not covered Cogentin , Ingrezza, or propranolol could be trialed.  Provider  also informed patient that he could try a lower dose of Austedo  to see if his symptoms are still managed.  If lower dose is effective patient may be able to wean off the medication completely.  He endorsed understanding and notes that he will take 18 mg daily instead of 18 mg twice daily.  Provider also encouraged patient to call [employer] patient care assistance to see if they have any other options for coverage of his medications.   Today provider conducted a GAD-7 and he scored a 0. Provider also conducted PHQ-9 he scored a 0. He edorses adequate sleep and appetite.      Patient agreeable to taking Austedo  18 mg daily instead of twice daily to see if symptoms are still managed.  If manage patient may be able to the medication here.  If ineffective other medications such as Cogentin , Ingrezza, or propranolol could be trialed.  No medication changes made today.       Visit Diagnosis:     ICD-10-CM   1. Attention deficit hyperactivity disorder (ADHD), predominantly inattentive type  F90.0 atomoxetine  (STRATTERA ) 80 MG capsule    prazosin  (MINIPRESS ) 1 MG capsule    2. Tardive dyskinesia  G24.01 Deutetrabenazine  (AUSTEDO ) 9 MG TABS             Past Psychiatric History: Patient reports he has a history of ADD, bipolar disorder, PTSD, OCD, substance use, and antisocial personality disorder   Past Medical History:  Past Medical History:  Diagnosis Date   PUD (peptic ulcer disease)  Past Surgical History:  Procedure Laterality Date   CERVICAL DISCECTOMY     MIDDLE EAR SURGERY      Family Psychiatric History: Mother adopted unknown mental issues. Niece ADHD, nephew autism, nephew schizophrenia and ADHD,  Family History: No family history on file.  Social History:  Social History   Socioeconomic History   Marital status: Single    Spouse name: Not on file   Number of children: Not on file   Years of education: Not on file   Highest education level: Not on file   Occupational History   Not on file  Tobacco Use   Smoking status: Former    Types: Cigarettes   Smokeless tobacco: Never  Vaping Use   Vaping status: Every Day  Substance and Sexual Activity   Alcohol use: Not Currently   Drug use: Not Currently    Types: Marijuana   Sexual activity: Not on file  Other Topics Concern   Not on file  Social History Narrative   Not on file   Social Drivers of Health   Financial Resource Strain: Not on file  Food Insecurity: Low Risk  (01/23/2024)   Received from [facility]   Hunger Vital Sign    Within the past 12 months, you worried that your food would run out before you got money to buy more: Never true    Within the past 12 months, the food you bought just didn't last and you didn't have money to get more. : Never true  Transportation Needs: No Transportation Needs (01/23/2024)   Received from [facility]    In the past 12 months, has lack of reliable transportation kept you from medical appointments, meetings, work or from getting things needed for daily living? : No  Physical Activity: Not on file  Stress: Not on file  Social Connections: Unknown (05/02/2022)   Received from [facility]   Social Network    Social Network: Not on file    Allergies: No Known Allergies  Metabolic Disorder Labs: No results found for: HGBA1C, MPG No results found for: PROLACTIN No results found for: CHOL, TRIG, HDL, CHOLHDL, VLDL, LDLCALC No results found for: TSH  Therapeutic Level Labs: No results found for: LITHIUM No results found for: VALPROATE No results found for: CBMZ  Current Medications: Current Outpatient Medications  Medication Sig Dispense Refill   albuterol (VENTOLIN HFA) 108 (90 Base) MCG/ACT inhaler Inhale 1-2 puffs into the lungs every 6 (six) hours as needed for wheezing or shortness of breath. 18 g 0   amoxicillin  (AMOXIL ) 875 MG tablet Take 1 tablet (875 mg total) by mouth 2 (two)  times daily for 5 days. 10 tablet 0   atomoxetine  (STRATTERA ) 80 MG capsule Take 1 capsule (80 mg total) by mouth daily. 30 capsule 3   benzonatate (TESSALON) 100 MG capsule Take 1 capsule (100 mg total) by mouth 3 (three) times daily as needed for cough. Do not take with alcohol or while operating or driving heavy machinery 21 capsule 0   clotrimazole  (LOTRIMIN ) 1 % cream Apply to affected area 2 times daily (Patient not taking: Reported on 11/18/2024) 60 g 0   Deutetrabenazine  (AUSTEDO ) 9 MG TABS Take 2 tablets by mouth 2 (two) times daily. 120 tablet 3   guaiFENesin (MUCINEX) 600 MG 12 hr tablet Take 1 tablet (600 mg total) by mouth 2 (two) times daily as needed for cough or to loosen phlegm. 30 tablet 0   prazosin  (MINIPRESS ) 1  MG capsule Take 3 capsules (3 mg total) by mouth at bedtime. 90 capsule 3   No current facility-administered medications for this visit.   He denies  Musculoskeletal: Strength & Muscle Tone: within normal limits and telehealth visit Gait & Station: normal, telehealth visit Patient leans: N/A  Psychiatric Specialty Exam: Review of Systems  There were no vitals taken for this visit.There is no height or weight on file to calculate BMI.  General Appearance: Well Groomed  Eye Contact:  Good  Speech:  Clear and Coherent and Normal Rate  Volume:  Normal  Mood:  Euthymic  Affect:  Appropriate and Congruent  Thought Process:  Coherent, Goal Directed and Linear  Orientation:  Full (Time, Place, and Person)  Thought Content: WDL and Logical   Suicidal Thoughts:  No  Homicidal Thoughts:  No  Memory:  Immediate;   Good Recent;   Good Remote;   Good  Judgement:  Good  Insight:  Good  Psychomotor Activity:  Normal  Concentration:  Concentration: Good and Attention Span: Good  Recall:  Good  Fund of Knowledge: Good  Language: Good  Akathisia:  No  Handed:  Right  AIMS (if indicated):not done  Assets:  Communication Skills Desire for Improvement Financial  Resources/Insurance Housing Social Support  ADL's:  Intact  Cognition: WNL  Sleep:  Good   Screenings: AIMS    Flowsheet Row Clinical Support from 05/21/2024 in [facility] Video Visit from 04/12/2023 in [facility]  AIMS Total Score 2 5   GAD-7    Flowsheet Row Video Visit from 11/21/2024 in [facility] Clinical Support from 05/21/2024 in [facility] Video Visit from 04/30/2024 in [facility] Video Visit from 02/07/2024 in [facility] Video Visit from 12/05/2023 in [facility]  Total GAD-7 Score 0 12 0 1 0   PHQ2-9    Flowsheet Row Video Visit from 11/21/2024 in [facility] Clinical Support from 05/21/2024 in [facility] Video Visit from 04/30/2024 in [facility] Video Visit from 02/07/2024 in [facility] Video Visit from 12/05/2023 in [facility]  PHQ-2 Total Score 0 1 0 2 0  PHQ-9 Total Score 0 6 0 3 --   Flowsheet Row Video Visit from 11/21/2024 in [facility] from 11/18/2024 in [facility] from 08/23/2024 in [facility] RISK CATEGORY No Risk No Risk No Risk     Assessment and Plan: Patient reports that he has some anxiety related to his insurance but is able to cope with it. He plans to look for new insurance. Patient also instructed to Call [employer] patient care assistance to see if they have other options for filling Austedo . Patient agreeable to taking Austedo  18 mg daily instead of twice daily to see if symptoms are still managed.  If manage patient may be able to the medication here.  If ineffective other medications such as Cogentin , Ingrezza, or propranolol  could be trailed  1. Attention deficit hyperactivity disorder (ADHD), predominantly inattentive type  Continue- atomoxetine  (STRATTERA ) 80 MG capsule; Take 1 capsule (80 mg total) by mouth daily.  Dispense: 30 capsule; Refill: 3 Continue- prazosin  (MINIPRESS ) 1 MG capsule; Take 3 capsules (3 mg total) by mouth at bedtime.  Dispense: 90 capsule;  Refill: 3  2. Tardive dyskinesia  Reduced/continue- Deutetrabenazine  (AUSTEDO ) 9 MG TABS; Take 2 tablets by mouth 2 (two) times daily.  Dispense: 120 tablet; Refill: 3    Follow-up in 3 months  Zane FORBES Bach, NP 11/21/2024, 1:17 PM

## 2024-11-29 ENCOUNTER — Other Ambulatory Visit: Payer: Self-pay

## 2024-11-29 ENCOUNTER — Other Ambulatory Visit (HOSPITAL_COMMUNITY): Payer: Self-pay

## 2024-11-29 NOTE — Progress Notes (Signed)
 Specialty Pharmacy Refill Coordination Note  MyChart Questionnaire Submission  Fernando Garrison is a 58 y.o. male contacted today regarding refills of specialty medication(s) Austedo .  Doses on hand: (Patient-Rptd) 10   Patient requested: (Patient-Rptd) Delivery   Delivery date: 12/03/24  Verified address: [address] [place] [address]  Medication will be filled on 12/02/24

## 2024-12-02 ENCOUNTER — Other Ambulatory Visit: Payer: Self-pay

## 2024-12-10 ENCOUNTER — Other Ambulatory Visit: Payer: Self-pay

## 2024-12-10 ENCOUNTER — Other Ambulatory Visit (HOSPITAL_COMMUNITY): Payer: Self-pay

## 2024-12-11 ENCOUNTER — Other Ambulatory Visit (HOSPITAL_COMMUNITY): Payer: Self-pay

## 2024-12-11 ENCOUNTER — Other Ambulatory Visit: Payer: Self-pay

## 2024-12-13 ENCOUNTER — Encounter (HOSPITAL_COMMUNITY): Payer: Self-pay

## 2024-12-13 ENCOUNTER — Other Ambulatory Visit: Payer: Self-pay

## 2024-12-13 ENCOUNTER — Ambulatory Visit (HOSPITAL_COMMUNITY)
Admission: EM | Admit: 2024-12-13 | Discharge: 2024-12-13 | Disposition: A | Discharge status: Home / Self Care | Payer: MEDICAID | Attending: Internal Medicine | Admitting: Internal Medicine

## 2024-12-13 DIAGNOSIS — H66001 Acute suppurative otitis media without spontaneous rupture of ear drum, right ear: Secondary | ICD-10-CM | POA: Diagnosis not present

## 2024-12-13 DIAGNOSIS — H9193 Unspecified hearing loss, bilateral: Secondary | ICD-10-CM | POA: Diagnosis not present

## 2024-12-13 DIAGNOSIS — H66015 Acute suppurative otitis media with spontaneous rupture of ear drum, recurrent, left ear: Secondary | ICD-10-CM | POA: Diagnosis not present

## 2024-12-13 MED ORDER — AMOXICILLIN-POT CLAVULANATE 875-125 MG PO TABS
1.0000 | ORAL_TABLET | Freq: Two times a day (BID) | ORAL | 0 refills | Status: DC
  Filled 2024-12-13: qty 14, 7d supply, fill #0

## 2024-12-13 NOTE — ED Provider Notes (Signed)
 " [facility]    CSN: [id number] Arrival date & time: 12/13/24  9189      History   Chief Complaint No chief complaint on file.   HPI Fernando Garrison is a 58 y.o. male.   Fernando Garrison is a 58 y.o. male presenting for chief complaint of decreased hearing from both ears with worsening symptoms of the left ear starting approximately 3 days ago.  He states he is unable to hear much out of his left ear.  He was treated for right otitis media and left ear effusion with amoxicillin  875 twice daily for 7 days starting on November 18, 2024.  He took all of the antibiotic and states his symptoms improved but returned 3 days ago.  He denies recent fever, chills, nausea, vomiting, malaise, persistent cough, shortness of breath, chest pain, neck pain, and dizziness.  No tinnitus or drainage.  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  He does not currently have a PCP and has not been evaluated by an ear nose and throat provider in many years.     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 Authorizing Provider  amoxicillin -clavulanate (AUGMENTIN ) 875-125 MG tablet Take 1 tablet by mouth every 12 (twelve) hours. 12/13/24  Yes Enedelia Dorna HERO, FNP  atomoxetine  (STRATTERA ) 80 MG capsule Take 1 capsule (80 mg total) by mouth daily. 11/21/24  Yes Harl Regan E, NP   Deutetrabenazine  (AUSTEDO ) 9 MG TABS Take 2 tablets by mouth 2 (two) times daily. 11/21/24  Yes Harl Regan E, NP  prazosin  (MINIPRESS ) 1 MG capsule Take 3 capsules (3 mg total) by mouth at bedtime. 11/21/24  Yes Harl Regan E, NP  albuterol  (VENTOLIN  HFA) 108 (90 Base) MCG/ACT inhaler Inhale 1-2 puffs into the lungs every 6 (six) hours as needed for wheezing or shortness of breath. 11/18/24   Chandra Harlene LABOR, NP  benzonatate  (TESSALON ) 100 MG capsule Take 1 capsule (100 mg total) by mouth 3 (three) times daily as needed for cough. Do not take with alcohol or while operating or driving heavy machinery 87/8/74   Chandra Harlene LABOR, NP  clotrimazole  (LOTRIMIN ) 1 % cream Apply to affected area 2 times daily Patient not taking: Reported on 11/18/2024 08/23/24   Dreama, [place]  N, FNP  guaiFENesin  (MUCINEX ) 600 MG 12 hr tablet Take 1 tablet (600 mg total) by mouth 2 (two) times daily as needed for cough or to loosen phlegm. 11/18/24   Chandra Harlene LABOR, NP  ARIPiprazole  (ABILIFY ) 5 MG tablet Take 1 tablet (5 mg total) by mouth daily. 01/21/21 02/16/21  Harl Regan BRAVO, NP  benztropine  (COGENTIN ) 0.5 MG tablet Take 2 tablets (1 mg total) by mouth 2 (two) times daily. 09/25/20 01/21/21  Harl Regan BRAVO, NP    Family History History reviewed. No pertinent family history.  Social History Social History[1]  Allergies   Patient has no known allergies.   Review of Systems Review of Systems Per HPI  Physical Exam Triage Vital Signs ED Triage Vitals [12/13/24 0859]  Encounter Vitals Group     BP 111/79     Girls Systolic BP Percentile      Girls Diastolic BP Percentile      Boys Systolic BP Percentile      Boys Diastolic BP Percentile      Pulse Rate (!) 108     Resp 18     Temp 98.8 F (37.1 C)     Temp Source Oral     SpO2 98 %     Weight      Height      Head Circumference      Peak Flow      Pain Score      Pain Loc      Pain Education      Exclude from Growth  Chart    No data found.  Updated Vital Signs BP 111/79 (BP Location: Left Arm)   Pulse (!) 108   Temp 98.8 F (37.1 C) (Oral)   Resp 18   SpO2 98%   Visual Acuity Right Eye Distance:   Left Eye Distance:   Bilateral Distance:    Right Eye Near:   Left Eye Near:    Bilateral Near:     Physical Exam Vitals and nursing note reviewed.  Constitutional:      Appearance: He is not ill-appearing or toxic-appearing.  HENT:     Head: Normocephalic and atraumatic.     Right Ear: Hearing, ear canal and external ear normal. Drainage present. A middle ear effusion is present. Tympanic membrane is erythematous.     Left Ear: Hearing, ear canal and external ear normal. Drainage present. A middle ear effusion is present. Tympanic membrane is perforated (perforated left TM appears to be healing) and erythematous.     Ears:     Comments: Purulent drainage to floor of left ear canal.     Nose: Nose normal.     Mouth/Throat:     Lips: Pink.     Mouth: Mucous membranes are moist. No injury or oral lesions.     Dentition: Normal dentition.     Tongue: No lesions.     Pharynx: Oropharynx is clear. Uvula midline. No pharyngeal swelling, oropharyngeal exudate, posterior oropharyngeal erythema, uvula swelling or postnasal drip.     Tonsils: No tonsillar exudate.  Eyes:     General: Lids are normal. Vision grossly intact. Gaze aligned appropriately.     Extraocular Movements: Extraocular movements intact.     Conjunctiva/sclera: Conjunctivae normal.  Neck:     Trachea: Trachea and phonation normal.  Pulmonary:     Effort: Pulmonary effort is normal.  Musculoskeletal:     Cervical back: Neck supple.  Lymphadenopathy:     Cervical: No cervical adenopathy.  Skin:    General: Skin is warm and dry.     Capillary Refill: Capillary refill takes less than 2 seconds.     Findings: No rash.  Neurological:     General: No focal deficit present.     Mental Status: He is alert and oriented to person,  place, and time. Mental status is at baseline.     Cranial Nerves: No dysarthria or facial asymmetry.  Psychiatric:        Mood and Affect: Mood normal.        Speech: Speech normal.  Behavior: Behavior normal.        Thought Content: Thought content normal.        Judgment: Judgment normal.      UC Treatments / Results  Labs (all labs ordered are listed, but only abnormal results are displayed) Labs Reviewed - No data to display  EKG   Radiology No results found.  Procedures Procedures (including critical care time)  Medications Ordered in UC Medications - No data to display  Initial Impression / Assessment and Plan / UC Course  I have reviewed the triage vital signs and the nursing notes.  Pertinent labs & imaging results that were available during my care of the patient were reviewed by me and considered in my medical decision making (see chart for details).   1.  Recurrent otitis media with spontaneous rupture of left tympanic membrane, recurrent otitis media of right ear without spontaneous rupture of tympanic membrane, decreased hearing of both ears Both ears appear infected on exam recurrently. Left eardrum appears to be healing from perforation.  There is residual purulent drainage to the floor of the left ear canal. Recently had amoxicillin , we will treat with Augmentin  this time twice daily for 10 days. I would like for him to return to urgent care or follow-up with PCP (made him a new PCP appointment today) in 2 weeks to recheck his ears to ensure they are healing appropriately. He may benefit from an ear nose and throat evaluation in the future and I have encouraged him to discuss ENT evaluation with his PCP.   Counseled patient on potential for adverse effects with medications prescribed/recommended today, strict ER and return-to-clinic precautions discussed, patient verbalized understanding.    Final Clinical Impressions(s) / UC Diagnoses   Final  diagnoses:  Decreased hearing of both ears  Recurrent acute suppurative otitis media with spontaneous rupture of left tympanic membrane  Non-recurrent acute suppurative otitis media of right ear without spontaneous rupture of tympanic membrane     Discharge Instructions      Take Augmentin  antibiotic every 12 hours for the next 10 days.   Take tylenol  1,000mg  every 6 hours as needed for pain.   I would like for you to either return to urgent care or follow-up with your primary care provider in 2 weeks for someone to re-check your ears and ensure the infections have healed appropriately.   If you develop any new or worsening symptoms or if your symptoms do not start to improve, please return here or follow-up with your primary care provider. If your symptoms are severe, please go to the emergency room.    ED Prescriptions     Medication Sig Dispense Auth. Provider   amoxicillin -clavulanate (AUGMENTIN ) 875-125 MG tablet Take 1 tablet by mouth every 12 (twelve) hours. 14 tablet Enedelia Dorna HERO, FNP      PDMP not reviewed this encounter.     [1]  Social History Tobacco Use   Smoking status: Former    Types: Cigarettes   Smokeless tobacco: Never  Vaping Use   Vaping status: Every Day  Substance Use Topics   Alcohol use: Not Currently   Drug use: Not Currently    Types: Marijuana     Enedelia Dorna HERO, [place] 12/13/24 [phone]  "

## 2024-12-13 NOTE — ED Triage Notes (Signed)
 Left ear pain and lose of hearing started 3 days ago. PT has not taken any medication to help with symptoms.

## 2024-12-13 NOTE — Discharge Instructions (Addendum)
 Take Augmentin  antibiotic every 12 hours for the next 10 days.   Take tylenol  1,000mg  every 6 hours as needed for pain.   I would like for you to either return to urgent care or follow-up with your primary care provider in 2 weeks for someone to re-check your ears and ensure the infections have healed appropriately.   If you develop any new or worsening symptoms or if your symptoms do not start to improve, please return here or follow-up with your primary care provider. If your symptoms are severe, please go to the emergency room.

## 2024-12-16 ENCOUNTER — Ambulatory Visit (HOSPITAL_COMMUNITY)
Admission: EM | Admit: 2024-12-16 | Discharge: 2024-12-16 | Disposition: A | Discharge status: Home / Self Care | Payer: MEDICAID | Attending: Emergency Medicine | Admitting: Emergency Medicine

## 2024-12-16 ENCOUNTER — Ambulatory Visit (INDEPENDENT_AMBULATORY_CARE_PROVIDER_SITE_OTHER): Payer: MEDICAID

## 2024-12-16 ENCOUNTER — Ambulatory Visit (HOSPITAL_COMMUNITY): Payer: Self-pay | Admitting: Emergency Medicine

## 2024-12-16 ENCOUNTER — Other Ambulatory Visit: Payer: Self-pay

## 2024-12-16 ENCOUNTER — Encounter (HOSPITAL_COMMUNITY): Payer: Self-pay

## 2024-12-16 DIAGNOSIS — H7292 Unspecified perforation of tympanic membrane, left ear: Secondary | ICD-10-CM | POA: Diagnosis not present

## 2024-12-16 DIAGNOSIS — M5442 Lumbago with sciatica, left side: Secondary | ICD-10-CM

## 2024-12-16 DIAGNOSIS — G8929 Other chronic pain: Secondary | ICD-10-CM | POA: Diagnosis not present

## 2024-12-16 DIAGNOSIS — H9192 Unspecified hearing loss, left ear: Secondary | ICD-10-CM | POA: Diagnosis not present

## 2024-12-16 DIAGNOSIS — M5441 Lumbago with sciatica, right side: Secondary | ICD-10-CM

## 2024-12-16 MED ORDER — PREDNISONE 10 MG (21) PO TBPK
ORAL_TABLET | Freq: Every day | ORAL | 0 refills | Status: DC
  Filled 2024-12-16: qty 21, 6d supply, fill #0

## 2024-12-16 NOTE — Discharge Instructions (Signed)
 Continue to keep water out of the left ear as your eardrum heals.  This may take several weeks to months.  Follow-up with ENT for any continued ear concerns.  Hearing will take a while to return as well.  Continue all antibiotics as prescribed.  Lumbar spine images are pending radiology overread.  Take the oral steroid taper packet as prescribed and in the morning with breakfast to help with pain and inflammation of the back.  Follow-up with sports medicine for chronic back pain, as they can provide further interventions and advanced imaging as needed.  Return to clinic for new or urgent symptoms.

## 2024-12-16 NOTE — ED Provider Notes (Signed)
 " [facility]    CSN: [id number] Arrival date & time: 12/16/24  9145      History   Chief Complaint No chief complaint on file.   HPI Fernando Garrison is a 58 y.o. male.   Patient presents to clinic requesting a recheck of his ear send evaluation of low back pain  Has been taking Augmentin  for otitis media with tympanic membrane perforation of the left ear.  Continues to have diminished hearing of the left ear.  Has not had any pain or drainage from either ear.  Has not had fever.  He reports a history of chronic back pain.  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  3 days ago he went to put on his shoes in the morning and he was unable to.  Reports maybe once areas of flareup of this low back pain.  Reports this is most severe flare up his head and he has had a hard time walking as well as getting in and out of a car.  He has not had any trauma, falls or injuries recently to trigger the back pain.  Denies any leg numbness.  When standing he does have shooting pain into both legs.  position changes triggered the back pain.  Has been taking ibuprofen  600 mg every 4 hours as well as using lidocaine  patches every 12 hours without any improvement.    The history is provided by the patient and medical records.    Past Medical History:  Diagnosis Date   PUD (peptic ulcer disease)     Patient Active Problem List   Diagnosis Date Noted   Generalized anxiety disorder 09/25/2020   Attention deficit hyperactivity disorder (ADHD), predominantly inattentive type 08/12/2020   Bipolar I disorder, most recent episode depressed (HCC) 08/12/2020   PTSD (post-traumatic stress disorder) 08/12/2020   Tardive dyskinesia 08/12/2020    Past Surgical History:  Procedure Laterality Date   CERVICAL DISCECTOMY     MIDDLE EAR SURGERY         Home Medications    Prior to Admission medications  Medication Sig Start Date End Date  Taking? Authorizing Provider  amoxicillin -clavulanate (AUGMENTIN ) 875-125 MG tablet Take 1 tablet by mouth every 12 (twelve) hours. 12/13/24  Yes Enedelia Dorna HERO, FNP  atomoxetine  (STRATTERA ) 80 MG capsule Take 1 capsule (80 mg total) by mouth daily. 11/21/24  Yes Harl Regan E, NP  Deutetrabenazine  (AUSTEDO ) 9 MG TABS Take 2 tablets by mouth 2 (two) times daily. 11/21/24  Yes Harl Regan E, NP  prazosin  (MINIPRESS ) 1 MG capsule Take 3 capsules (3 mg total) by mouth at bedtime. 11/21/24  Yes Harl Regan E, NP  predniSONE  (STERAPRED UNI-PAK 21 TAB) 10 MG (21) TBPK tablet Take by mouth daily. Take as prescribed, in the morning with breakfast 12/16/24  Yes Jarrell Armond  N, FNP  albuterol  (VENTOLIN  HFA) 108 (90 Base) MCG/ACT inhaler Inhale 1-2 puffs into the lungs every 6 (six) hours as needed for wheezing or shortness of breath. 11/18/24   Chandra Harlene LABOR, NP  clotrimazole  (LOTRIMIN ) 1 % cream Apply to affected area 2 times daily Patient not taking: Reported on 11/18/2024 08/23/24   Dreama, Neave Lenger  N, FNP  ARIPiprazole  (ABILIFY ) 5 MG tablet Take 1 tablet (5 mg total) by mouth daily. 01/21/21 02/16/21  Harl Regan BRAVO, NP  benztropine  (COGENTIN ) 0.5 MG tablet Take 2 tablets (1 mg total) by mouth 2 (two) times daily. 09/25/20 01/21/21  Harl Zane BRAVO, NP    Family History History reviewed. No pertinent family history.  Social History Social History[1]   Allergies   Patient has no known allergies.   Review of Systems Review of Systems  Per HPI  Physical Exam Triage Vital Signs ED Triage Vitals [12/16/24 1004]  Encounter Vitals Group     BP 133/87     Girls Systolic BP Percentile      Girls Diastolic BP Percentile      Boys Systolic BP Percentile      Boys Diastolic BP Percentile      Pulse Rate 100     Resp 18     Temp 97.6 F (36.4 C)     Temp Source Oral     SpO2 94 %     Weight      Height      Head Circumference      Peak Flow      Pain Score       Pain Loc      Pain Education      Exclude from Growth Chart    No data found.  Updated Vital Signs BP 133/87 (BP Location: Left Arm)   Pulse 100   Temp 97.6 F (36.4 C) (Oral)   Resp 18   SpO2 94%   Visual Acuity Right Eye Distance:   Left Eye Distance:   Bilateral Distance:    Right Eye Near:   Left Eye Near:    Bilateral Near:     Physical Exam Vitals and nursing note reviewed.  Constitutional:      Appearance: Normal appearance.  HENT:     Head: Normocephalic and atraumatic.     Right Ear: Tympanic membrane is erythematous.     Left Ear: Tympanic membrane is perforated and erythematous.     Nose: Nose normal.     Mouth/Throat:     Mouth: Mucous membranes are moist.  Eyes:     Conjunctiva/sclera: Conjunctivae normal.  Cardiovascular:     Rate and Rhythm: Normal rate.  Pulmonary:     Effort: Pulmonary effort is normal. No respiratory distress.  Musculoskeletal:        General: Tenderness present. No swelling or signs of injury.     Lumbar back: Tenderness present.       Back:     Comments: Midline lumbar spine tenderness to palpation, reproducible with movement.  Without step-off, crepitus or deformity.  No overlying bruising or rashes.  Skin:    General: Skin is warm and dry.     Findings: No bruising or rash.  Neurological:     General: No focal deficit present.     Mental Status: He is alert.  Psychiatric:        Mood and Affect: Mood normal.        Behavior: Behavior is cooperative.      UC Treatments / Results  Labs (all labs ordered are listed, but only abnormal results are displayed) Labs Reviewed - No data to display  EKG   Radiology No results found.  Procedures Procedures (including critical care time)  Medications Ordered in UC Medications - No data to display  Initial Impression / Assessment and Plan / UC Course  I have reviewed the triage vital signs and the nursing notes.  Pertinent labs & imaging results that were  available during my care of the patient were reviewed by me and considered in my medical decision making (see chart for details).  Vitals  and triage reviewed, patient is hemodynamically stable.  Bilateral tympanic membranes are erythematous, without bulging.  Healing perforated left-sided tympanic membrane.  Continue antibiotics as prescribed.  ENT follow-up as needed.  Lumbar spine is tender to palpation, atraumatic.  History of chronic low back pain.  Imaging by my interpretation does not show acute bony abnormality, awaiting official radiology overread.  With chronic low back pain flare, will trial steroid taper and encouraged orthopedic follow-up.  Without red flag symptoms of cauda equina, and or leg numbness or incontinence.  Plan of care, follow-up care return precautions given, no questions at this time.     Final Clinical Impressions(s) / UC Diagnoses   Final diagnoses:  Chronic midline low back pain with bilateral sciatica  Decreased hearing of left ear  Tympanic membrane rupture, left     Discharge Instructions      Continue to keep water out of the left ear as your eardrum heals.  This may take several weeks to months.  Follow-up with ENT for any continued ear concerns.  Hearing will take a while to return as well.  Continue all antibiotics as prescribed.  Lumbar spine images are pending radiology overread.  Take the oral steroid taper packet as prescribed and in the morning with breakfast to help with pain and inflammation of the back.  Follow-up with sports medicine for chronic back pain, as they can provide further interventions and advanced imaging as needed.  Return to clinic for new or urgent symptoms.      ED Prescriptions     Medication Sig Dispense Auth. Provider   predniSONE  (STERAPRED UNI-PAK 21 TAB) 10 MG (21) TBPK tablet Take by mouth daily. Take as prescribed, in the morning with breakfast 21 tablet Dreama, Yaretzi Ernandez  N, FNP      I have reviewed the  PDMP during this encounter.     [1]  Social History Tobacco Use   Smoking status: Former    Types: Cigarettes   Smokeless tobacco: Never  Vaping Use   Vaping status: Every Day  Substance Use Topics   Alcohol use: Not Currently   Drug use: Not Currently    Types: Marijuana     Dreama Gordy SAILOR, FNP 12/16/24 1114  "

## 2024-12-16 NOTE — ED Triage Notes (Signed)
 Patient is here for Left ear follow-up. PT states he has back pain and would like medication to help.

## 2024-12-22 ENCOUNTER — Ambulatory Visit (HOSPITAL_COMMUNITY)
Admission: EM | Admit: 2024-12-22 | Discharge: 2024-12-22 | Disposition: A | Discharge status: Home / Self Care | Payer: MEDICAID

## 2024-12-22 ENCOUNTER — Encounter (HOSPITAL_COMMUNITY): Payer: Self-pay | Admitting: *Deleted

## 2024-12-22 DIAGNOSIS — H9192 Unspecified hearing loss, left ear: Secondary | ICD-10-CM

## 2024-12-22 NOTE — Discharge Instructions (Addendum)
 As discussed, hearing may take months to return after tympanic membrane rupture.  Wear a cotton ball in your ear when you shower to help avoid it in the ear behind the tympanic membrane or in the canal.  Follow-up with your primary care provider as scheduled and they can refer to ENT as needed.  Return to clinic or seek follow-up care if you develop any pain, fever or drainage from the ear.

## 2024-12-22 NOTE — ED Triage Notes (Signed)
 Pt states this is his 3rd time coming to [facility] for his ears. He has finished the antibiotics but still can't hear. He said steroids helped some.

## 2024-12-22 NOTE — ED Provider Notes (Signed)
 " [facility]    CSN: [id number] Arrival date & time: 12/22/24  9188      History   Chief Complaint Chief Complaint  Patient presents with   Hearing Loss    HPI Fernando Garrison is a 59 y.o. male.   Patient presents to clinic over continued concern with the left ear where it feels muffled.  He has not had any pain or drainage or fever.  Has been seen multiple times for this and reports this is a chronic issue.  In order to see ENT he needs a referral from a primary care provider, which she currently does not have but has an appointment scheduled for February with the PCP.  Since last visit he has finished his Augmentin .  He also finished oral steroids that were sent in for chronic low back pain.  This seemed to help his back and he is able to bend over and put his shoes on.    The history is provided by the patient and medical records.    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 Authorizing Provider  atomoxetine  (STRATTERA ) 80 MG capsule Take 1 capsule (80 mg total) by mouth daily. 11/21/24  Yes Harl Regan E, NP  Deutetrabenazine  (AUSTEDO ) 9 MG TABS Take 2 tablets by mouth 2 (two) times daily. 11/21/24  Yes Harl Regan E, NP  prazosin  (MINIPRESS ) 1 MG capsule Take 3 capsules (3 mg total) by mouth at bedtime. 11/21/24  Yes Harl Regan E, NP  albuterol  (VENTOLIN  HFA) 108 (90 Base)  MCG/ACT inhaler Inhale 1-2 puffs into the lungs every 6 (six) hours as needed for wheezing or shortness of breath. 11/18/24   Chandra Harlene LABOR, NP  amoxicillin -clavulanate (AUGMENTIN ) 875-125 MG tablet Take 1 tablet by mouth every 12 (twelve) hours. 12/13/24   Enedelia Dorna HERO, FNP  clotrimazole  (LOTRIMIN ) 1 % cream Apply to affected area 2 times daily Patient not taking: Reported on 11/18/2024 08/23/24   Dreama, Rolan Wrightsman  N, FNP  predniSONE  (STERAPRED UNI-PAK 21 TAB) 10 MG (21) TBPK tablet Take by mouth daily. Take as prescribed, in the morning with breakfast 12/16/24   Dreama, Brennan Litzinger  N, FNP  ARIPiprazole  (ABILIFY ) 5 MG tablet Take 1 tablet (5 mg total) by mouth daily. 01/21/21 02/16/21  Harl Regan BRAVO, NP  benztropine  (COGENTIN ) 0.5 MG tablet Take 2 tablets (1 mg total) by mouth 2 (two) times daily. 09/25/20 01/21/21  Harl Regan BRAVO, NP    Family History History reviewed. No pertinent family history.  Social History Social History[1]   Allergies   Patient has no known allergies.   Review of Systems Review of Systems  Per HPI  Physical Exam Triage Vital Signs ED Triage Vitals  Encounter Vitals Group     BP 12/22/24 0821 111/72     Girls Systolic BP Percentile --      Girls Diastolic BP Percentile --  Boys Systolic BP Percentile --      Boys Diastolic BP Percentile --      Pulse Rate 12/22/24 0821 76     Resp 12/22/24 0821 18     Temp 12/22/24 0821 (!) 97.4 F (36.3 C)     Temp Source 12/22/24 0821 Oral     SpO2 12/22/24 0821 95 %     Weight --      Height --      Head Circumference --      Peak Flow --      Pain Score 12/22/24 0819 0     Pain Loc --      Pain Education --      Exclude from Growth Chart --    No data found.  Updated Vital Signs BP 111/72 (BP Location: Right Arm)   Pulse 76   Temp (!) 97.4 F (36.3 C) (Oral)   Resp 18   SpO2 95%   Visual Acuity Right Eye Distance:   Left Eye Distance:   Bilateral Distance:    Right Eye  Near:   Left Eye Near:    Bilateral Near:     Physical Exam Vitals and nursing note reviewed.  Constitutional:      Appearance: Normal appearance.  HENT:     Head: Normocephalic and atraumatic.     Right Ear: Tympanic membrane, ear canal and external ear normal.     Left Ear: A middle ear effusion is present. Tympanic membrane is perforated.     Nose: Nose normal.     Mouth/Throat:     Mouth: Mucous membranes are moist.  Eyes:     Conjunctiva/sclera: Conjunctivae normal.  Cardiovascular:     Rate and Rhythm: Normal rate.  Pulmonary:     Effort: Pulmonary effort is normal. No respiratory distress.  Neurological:     General: No focal deficit present.     Mental Status: He is alert.  Psychiatric:        Mood and Affect: Mood normal.        Behavior: Behavior is cooperative.      UC Treatments / Results  Labs (all labs ordered are listed, but only abnormal results are displayed) Labs Reviewed - No data to display  EKG   Radiology No results found.  Procedures Procedures (including critical care time)  Medications Ordered in UC Medications - No data to display  Initial Impression / Assessment and Plan / UC Course  I have reviewed the triage vital signs and the nursing notes.  Pertinent labs & imaging results that were available during my care of the patient were reviewed by me and considered in my medical decision making (see chart for details).  Vitals and triage reviewed, patient is hemodynamically stable.  Appears to have a middle ear effusion with perforated TM.  Encouraged cottonball use when showering to help avoid any water in the middle ear.  Will defer any other antibiotic use and encouraged ENT follow-up.  Discussed muffled hearing may persist for weeks to months after tympanic membrane rupture.  Plan of care, follow-up care return precautions given, no questions at this time.    Final Clinical Impressions(s) / UC Diagnoses   Final diagnoses:   Decreased hearing of left ear     Discharge Instructions      As discussed, hearing may take months to return after tympanic membrane rupture.  Wear a cotton ball in your ear when you shower to help avoid it in the ear  behind the tympanic membrane or in the canal.  Follow-up with your primary care provider as scheduled and they can refer to ENT as needed.  Return to clinic or seek follow-up care if you develop any pain, fever or drainage from the ear.     ED Prescriptions   None    PDMP not reviewed this encounter.    [1]  Social History Tobacco Use   Smoking status: Former    Types: Cigarettes   Smokeless tobacco: Never  Vaping Use   Vaping status: Every Day  Substance Use Topics   Alcohol use: Not Currently   Drug use: Not Currently    Types: Marijuana     Dreama Zarea Diesing  N, FNP 12/22/24 [id number]  "

## 2024-12-25 ENCOUNTER — Other Ambulatory Visit: Payer: Self-pay

## 2024-12-27 ENCOUNTER — Other Ambulatory Visit (HOSPITAL_COMMUNITY): Payer: Self-pay

## 2024-12-27 NOTE — Progress Notes (Signed)
 Specialty Pharmacy Refill Coordination Note  Fernando Garrison is a 59 y.o. male contacted today regarding refills of specialty medication(s) Deutetrabenazine  (Austedo )   Patient requested Delivery   Delivery date: 01/03/25   Verified address: [address] [name] [address]   Medication will be filled on: 01/02/25

## 2024-12-28 ENCOUNTER — Encounter (INDEPENDENT_AMBULATORY_CARE_PROVIDER_SITE_OTHER): Payer: Self-pay

## 2024-12-30 ENCOUNTER — Other Ambulatory Visit (HOSPITAL_COMMUNITY): Payer: Self-pay

## 2024-12-30 NOTE — Telephone Encounter (Signed)
 Patient spoke with Velma, CPhT prior to responding to questionnaire. Shipping 1/15 for 1/16 delivery

## 2025-01-02 ENCOUNTER — Other Ambulatory Visit: Payer: Self-pay

## 2025-01-09 ENCOUNTER — Other Ambulatory Visit: Payer: Self-pay

## 2025-01-13 ENCOUNTER — Other Ambulatory Visit: Payer: Self-pay

## 2025-01-20 ENCOUNTER — Ambulatory Visit: Payer: MEDICAID | Admitting: Medical

## 2025-01-21 ENCOUNTER — Encounter: Payer: Self-pay | Admitting: Medical

## 2025-01-21 ENCOUNTER — Ambulatory Visit: Payer: MEDICAID | Admitting: Medical

## 2025-01-21 VITALS — BP 120/70 | HR 72 | Temp 98.7°F | Ht 70.0 in | Wt 253.0 lb

## 2025-01-21 DIAGNOSIS — Z7289 Other problems related to lifestyle: Secondary | ICD-10-CM

## 2025-01-21 DIAGNOSIS — H9393 Unspecified disorder of ear, bilateral: Secondary | ICD-10-CM

## 2025-01-21 DIAGNOSIS — Z23 Encounter for immunization: Secondary | ICD-10-CM

## 2025-01-21 DIAGNOSIS — H9190 Unspecified hearing loss, unspecified ear: Secondary | ICD-10-CM | POA: Diagnosis not present

## 2025-01-21 DIAGNOSIS — R937 Abnormal findings on diagnostic imaging of other parts of musculoskeletal system: Secondary | ICD-10-CM | POA: Diagnosis not present

## 2025-01-21 DIAGNOSIS — M549 Dorsalgia, unspecified: Secondary | ICD-10-CM | POA: Diagnosis not present

## 2025-01-21 DIAGNOSIS — G8929 Other chronic pain: Secondary | ICD-10-CM

## 2025-01-21 DIAGNOSIS — M5410 Radiculopathy, site unspecified: Secondary | ICD-10-CM | POA: Diagnosis not present

## 2025-01-22 ENCOUNTER — Other Ambulatory Visit: Payer: Self-pay

## 2025-01-22 ENCOUNTER — Telehealth (INDEPENDENT_AMBULATORY_CARE_PROVIDER_SITE_OTHER): Payer: MEDICAID | Admitting: Psychiatry

## 2025-01-22 ENCOUNTER — Encounter (HOSPITAL_COMMUNITY): Payer: Self-pay | Admitting: Psychiatry

## 2025-01-22 DIAGNOSIS — F900 Attention-deficit hyperactivity disorder, predominantly inattentive type: Secondary | ICD-10-CM

## 2025-01-22 DIAGNOSIS — G2401 Drug induced subacute dyskinesia: Secondary | ICD-10-CM | POA: Diagnosis not present

## 2025-01-22 MED ORDER — AUSTEDO 9 MG PO TABS
18.0000 mg | ORAL_TABLET | Freq: Two times a day (BID) | ORAL | 3 refills | Status: AC
  Filled 2025-01-22 – 2025-01-24 (×2): qty 120, 30d supply, fill #0

## 2025-01-22 MED ORDER — PRAZOSIN HCL 2 MG PO CAPS
4.0000 mg | ORAL_CAPSULE | Freq: Every day | ORAL | 3 refills | Status: AC
  Filled 2025-01-22: qty 60, 30d supply, fill #0

## 2025-01-22 MED ORDER — ATOMOXETINE HCL 80 MG PO CAPS
80.0000 mg | ORAL_CAPSULE | Freq: Every day | ORAL | 3 refills | Status: AC
  Filled 2025-01-22: qty 30, 30d supply, fill #0

## 2025-01-22 NOTE — Progress Notes (Signed)
 BH MD/PA/NP OP Progress Note Virtual Visit via Video Note  I connected with Fernando Garrison on 01/22/25 at  4:00 PM EST by a video enabled telemedicine application and verified that I am speaking with the correct person using two identifiers.  Location: Patient: Work Provider: Clinic   I discussed the limitations of evaluation and management by telemedicine and the availability of in person appointments. The patient expressed understanding and agreed to proceed.  I provided 30 minutes of non-face-to-face time during this encounter.          01/22/2025 4:12 PM Fernando Garrison  MRN:  [id number]  Chief Complaint:I have been having nightmares    HPI: 59 year old male seen today for follow-up psychiatric evaluation. Patient has a history of OCD, ADD, bipolar 1, tardive dyskinesia, antisocial personality disorder, substance use (sober 5 years), and PTSD and is currently managed on Straterra 80mg  daily (receives via patient care assistance), Austedo  18mg  daily  (receives via patient care assistance),and Prazosin  3mg  at bedtime.  Patient notes that he medications are somewhat effective in managing his psychiatric conditions.  Today he was well-groomed, pleasant, cooperative, and engaged in conversation.  He informed clinical research associate that he has been having nightmares.  He informed clinical research associate that since arvinmeritor he has been prescribed a different form of prazosin .  Patient notes that he wakes up several times during the night.  He informed clinical research associate that he is sleeping approximately 4 to 5 hours.    Since his last visit he notes that his anxiety and depression continues to be well-managed.  Today provider conducted GAD-7 and he scored a 0, at his last visit he scored a 0.  Provider also conducted PHQ-9 the patient but is 0, at his last visit he scored a 0.  He endorsed having adequate appetite.  Today he denies SI/HI/VH, mania, paranoia.  Patient informed clinical research associate that  in February or March his medicaid will  run out.  Patient notes that he has been taking Austedo  18 mg daily instead of 18 mg twice daily. He notes that he has mild symptoms of TD but is trying to cope.  Provider informed patient that if Austedo  is not covered by his new insurance Cogentin , Ingrezza, or propranolol could be trialed.  Provider also encouraged patient to call [employer] patient care assistance to see if they have any other options for coverage of his medications.  Today prazosin  3 mg increased to 4 mg to help manage nightmares.  He will continue other medications as prescribed.  No other concerns at this time.      Visit Diagnosis:     ICD-10-CM   1. Attention deficit hyperactivity disorder (ADHD), predominantly inattentive type  F90.0 atomoxetine  (STRATTERA ) 80 MG capsule    prazosin  (MINIPRESS ) 2 MG capsule    2. Tardive dyskinesia  G24.01 Deutetrabenazine  (AUSTEDO ) 9 MG TABS              Past Psychiatric History: Patient reports he has a history of ADD, bipolar disorder, PTSD, OCD, substance use, and antisocial personality disorder   Past Medical History:  Past Medical History:  Diagnosis Date   PUD (peptic ulcer disease)     Past Surgical History:  Procedure Laterality Date   CERVICAL DISCECTOMY     MIDDLE EAR SURGERY      Family Psychiatric History: Mother adopted unknown mental issues. Niece ADHD, nephew autism, nephew schizophrenia and ADHD,  Family History: No family history on file.  Social History:  Social History  Socioeconomic History   Marital status: Single    Spouse name: Not on file   Number of children: Not on file   Years of education: Not on file   Highest education level: GED or equivalent  Occupational History   Not on file  Tobacco Use   Smoking status: Former    Types: Cigarettes   Smokeless tobacco: Never  Vaping Use   Vaping status: Every Day  Substance and Sexual Activity   Alcohol use: Not Currently   Drug use: Not Currently    Types: Marijuana   Sexual  activity: Not Currently  Other Topics Concern   Not on file  Social History Narrative   Not on file   Social Drivers of Health   Tobacco Use: Medium Risk (01/21/2025)   Patient History    Smoking Tobacco Use: Former    Smokeless Tobacco Use: Never    Passive Exposure: Not on file  Financial Resource Strain: Low Risk (01/20/2025)   Overall Financial Resource Strain (CARDIA)    Difficulty of Paying Living Expenses: Not very hard  Food Insecurity: Patient Declined (01/20/2025)   Epic    Worried About Programme Researcher, Broadcasting/film/video in the Last Year: Patient declined    Barista in the Last Year: Patient declined  Transportation Needs: No Transportation Needs (01/20/2025)   Epic    Lack of Transportation (Medical): No    Lack of Transportation (Non-Medical): No  Physical Activity: Inactive (01/20/2025)   Exercise Vital Sign    Days of Exercise per Week: 0 days    Minutes of Exercise per Session: Not on file  Stress: No Stress Concern Present (01/20/2025)   [employer] of Occupational Health - Occupational Stress Questionnaire    Feeling of Stress: Not at all  Social Connections: Socially Isolated (01/20/2025)   Social Connection and Isolation Panel    Frequency of Communication with Friends and Family: More than three times a week    Frequency of Social Gatherings with Friends and Family: More than three times a week    Attends Religious Services: Never    Database Administrator or Organizations: No    Attends Engineer, Structural: Not on file    Marital Status: Divorced  Depression (PHQ2-9): Low Risk (01/22/2025)   Depression (PHQ2-9)    PHQ-2 Score: 0  Alcohol Screen: Not on file  Housing: Unknown (01/20/2025)   Epic    Unable to Pay for Housing in the Last Year: No    Number of Times Moved in the Last Year: Not on file    Homeless in the Last Year: No  Utilities: Low Risk (01/23/2024)   Received from [facility]   Utilities    In the past 12 months has the electric, gas,  oil, or water company threatened to shut off services in your home? : No  Health Literacy: Not on file    Allergies: No Known Allergies  Metabolic Disorder Labs: No results found for: HGBA1C, MPG No results found for: PROLACTIN No results found for: CHOL, TRIG, HDL, CHOLHDL, VLDL, LDLCALC No results found for: TSH  Therapeutic Level Labs: No results found for: LITHIUM No results found for: VALPROATE No results found for: CBMZ  Current Medications: Current Outpatient Medications  Medication Sig Dispense Refill   atomoxetine  (STRATTERA ) 80 MG capsule Take 1 capsule (80 mg total) by mouth daily. 30 capsule 3   Deutetrabenazine  (AUSTEDO ) 9 MG TABS Take 2 tablets by mouth 2 (two) times daily.  120 tablet 3   prazosin  (MINIPRESS ) 2 MG capsule Take 2 capsules (4 mg total) by mouth at bedtime. 60 capsule 3   No current facility-administered medications for this visit.   He denies  Musculoskeletal: Strength & Muscle Tone: within normal limits and telehealth visit Gait & Station: normal, telehealth visit Patient leans: N/A  Psychiatric Specialty Exam: Review of Systems  There were no vitals taken for this visit.There is no height or weight on file to calculate BMI.  General Appearance: Well Groomed  Eye Contact:  Good  Speech:  Clear and Coherent and Normal Rate  Volume:  Normal  Mood:  Euthymic  Affect:  Appropriate and Congruent  Thought Process:  Coherent, Goal Directed and Linear  Orientation:  Full (Time, Place, and Person)  Thought Content: WDL and Logical   Suicidal Thoughts:  No  Homicidal Thoughts:  No  Memory:  Immediate;   Good Recent;   Good Remote;   Good  Judgement:  Good  Insight:  Good  Psychomotor Activity:  Normal  Concentration:  Concentration: Good and Attention Span: Good  Recall:  Good  Fund of Knowledge: Good  Language: Good  Akathisia:  No  Handed:  Right  AIMS (if indicated):not done  Assets:  Communication  Skills Desire for Improvement Financial Resources/Insurance Housing Social Support  ADL's:  Intact  Cognition: WNL  Sleep:  Fair   Screenings: AIMS    Flowsheet Row Clinical Support from 05/21/2024 in [facility] Video Visit from 04/12/2023 in [facility]  AIMS Total Score 2 5   GAD-7    Flowsheet Row Video Visit from 01/22/2025 in [facility] Video Visit from 11/21/2024 in [facility] Clinical Support from 05/21/2024 in [facility] Video Visit from 04/30/2024 in [facility] Video Visit from 02/07/2024 in [facility]  Total GAD-7 Score 0 0 12 0 1   PHQ2-9    Flowsheet Row Video Visit from 01/22/2025 in [facility] Office Visit from 01/21/2025 in [facility] Video Visit from 11/21/2024 in [facility] Clinical Support from 05/21/2024 in [facility] Video Visit from 04/30/2024 in [facility]  PHQ-2 Total Score 0 0 0 1 0  PHQ-9 Total Score 0 -- 0 6 0   Flowsheet Row UC from 12/22/2024 in [facility] UC from 12/16/2024 in [facility] UC from 12/13/2024 in [facility] RISK CATEGORY No Risk No Risk No Risk     Assessment and Plan: Patient reports that he has been having nightmares.  She is switched insurance and is uncertain if his Austedo  will continue to be covered.Patient informed clinical research associate that  in February or March his medicaid will run out.  Patient notes that he has been taking Austedo  18 mg daily instead of 18 mg twice daily. He notes that he has mild symptoms of TD but is trying to cope.  Provider informed patient that if Austedo  is not covered by his new insurance Cogentin , Ingrezza, or  propranolol could be trialed.  Provider also encouraged patient to call [employer] patient care assistance to see if they have any other options for coverage of his medications.  Today prazosin  3 mg increased to 4 mg to help manage nightmares.  He will continue other medications as  prescribed.  No other concerns at this time.  1. Attention deficit hyperactivity disorder (ADHD), predominantly inattentive type  Continue- atomoxetine  (STRATTERA ) 80 MG capsule; Take 1 capsule (80 mg total) by mouth daily.  Dispense: 30 increased capsule; Refill: 3 - prazosin  (MINIPRESS ) 2 MG capsule; Take 2 capsules (4 mg total) by mouth at bedtime.  Dispense: 60 capsule; Refill: 3  2. Tardive dyskinesia  Continue- Deutetrabenazine  (AUSTEDO ) 9 MG TABS; Take 2 tablets by mouth 2 (two) times daily.  Dispense: 120 tablet; Refill: 3    Follow-up in 3 months  Zane FORBES Bach, NP 01/22/2025, 4:12 PM

## 2025-01-24 ENCOUNTER — Encounter (INDEPENDENT_AMBULATORY_CARE_PROVIDER_SITE_OTHER): Payer: Self-pay

## 2025-01-24 ENCOUNTER — Other Ambulatory Visit: Payer: Self-pay

## 2025-03-10 ENCOUNTER — Encounter: Payer: MEDICAID | Admitting: Medical

## 2025-03-10 ENCOUNTER — Ambulatory Visit: Payer: MEDICAID | Admitting: Orthopedic Surgery

## 2025-04-09 ENCOUNTER — Telehealth (HOSPITAL_COMMUNITY): Payer: MEDICAID | Admitting: Psychiatry
